# Patient Record
Sex: Female | Born: 1985 | Race: Black or African American | Hispanic: No | Marital: Single | Smoking: Current every day smoker
Health system: Southern US, Community
[De-identification: ages and names within clinical notes are randomized; demographics above are authoritative.]

## PROBLEM LIST (undated history)

## (undated) DIAGNOSIS — K029 Dental caries, unspecified: Secondary | ICD-10-CM

## (undated) DIAGNOSIS — R51 Headache: Secondary | ICD-10-CM

## (undated) DIAGNOSIS — R519 Headache, unspecified: Secondary | ICD-10-CM

## (undated) DIAGNOSIS — D649 Anemia, unspecified: Secondary | ICD-10-CM

## (undated) HISTORY — PX: CHOLECYSTECTOMY: SHX55

---

## 2016-03-07 ENCOUNTER — Emergency Department (HOSPITAL_BASED_OUTPATIENT_CLINIC_OR_DEPARTMENT_OTHER): Payer: Self-pay

## 2016-03-07 ENCOUNTER — Encounter (HOSPITAL_BASED_OUTPATIENT_CLINIC_OR_DEPARTMENT_OTHER): Payer: Self-pay | Admitting: *Deleted

## 2016-03-07 ENCOUNTER — Emergency Department (HOSPITAL_BASED_OUTPATIENT_CLINIC_OR_DEPARTMENT_OTHER)
Admission: EM | Admit: 2016-03-07 | Discharge: 2016-03-07 | Disposition: A | Discharge status: Home / Self Care | Payer: Self-pay | Attending: Emergency Medicine | Admitting: Emergency Medicine

## 2016-03-07 DIAGNOSIS — S8000XA Contusion of unspecified knee, initial encounter: Secondary | ICD-10-CM

## 2016-03-07 DIAGNOSIS — W109XXA Fall (on) (from) unspecified stairs and steps, initial encounter: Secondary | ICD-10-CM | POA: Insufficient documentation

## 2016-03-07 DIAGNOSIS — F17210 Nicotine dependence, cigarettes, uncomplicated: Secondary | ICD-10-CM | POA: Insufficient documentation

## 2016-03-07 DIAGNOSIS — Y929 Unspecified place or not applicable: Secondary | ICD-10-CM | POA: Insufficient documentation

## 2016-03-07 DIAGNOSIS — Y9301 Activity, walking, marching and hiking: Secondary | ICD-10-CM | POA: Insufficient documentation

## 2016-03-07 DIAGNOSIS — Y999 Unspecified external cause status: Secondary | ICD-10-CM | POA: Insufficient documentation

## 2016-03-07 DIAGNOSIS — S81011A Laceration without foreign body, right knee, initial encounter: Secondary | ICD-10-CM | POA: Insufficient documentation

## 2016-03-07 NOTE — ED Notes (Signed)
Bacitrain and dressing applied to wound.

## 2016-03-07 NOTE — ED Notes (Signed)
Patient transported to X-ray ambulatory with tech. 

## 2016-03-07 NOTE — ED Provider Notes (Signed)
CSN: [id number]     Arrival date & time 03/07/16  1055 History   First MD Initiated Contact with Patient 03/07/16 1103     Chief Complaint  Patient presents with  . Knee Injury   HPI Patient presents to the emergency room with complaints of right knee pain. Patient was walking up steps yesterday while holding her son. It was raining outside and she slipped on the step. Because she was holding her son she was not able to brace herself properly and she fell with her right knee against the step. Patient sustained a laceration and abrasion. She cleaned it out well last evening.  She was able to walk around on it. However, this morning when she woke up she had increasing pain and stiffness. She thinks it's mostly just sore but wants to make sure there was nothing else going on. She denies any fevers or chills. No numbness or weakness. No other injuries. Tetanus is up to date. History reviewed. No pertinent past medical history. Past Surgical History  Procedure Laterality Date  . Cholecystectomy     No family history on file. Social History  Substance Use Topics  . Smoking status: Current Every Day Smoker    Types: Cigarettes  . Smokeless tobacco: None  . Alcohol Use: Yes   OB History    No data available     Review of Systems    Allergies  Review of patient's allergies indicates no known allergies.  Home Medications   Prior to Admission medications   Not on File   BP 132/90 mmHg  Pulse 69  Temp(Src) 98.8 F (37.1 C) (Oral)  Resp 20  Ht  (1.626 m)  Wt 80.74 kg  BMI 30.54 kg/m2  SpO2 100%  LMP 02/29/2016 Physical Exam  Constitutional: She appears well-developed and well-nourished. No distress.  HENT:  Head: Normocephalic and atraumatic.  Right Ear: External ear normal.  Left Ear: External ear normal.  Eyes: Conjunctivae are normal. Right eye exhibits no discharge. Left eye exhibits no discharge. No scleral icterus.  Neck: Neck supple. No tracheal deviation present.   Cardiovascular: Normal rate.   Pulmonary/Chest: Effort normal. No stridor. No respiratory distress.  Musculoskeletal: She exhibits no edema.       Right knee: She exhibits laceration. She exhibits no swelling, no deformity, no erythema, normal alignment, no LCL laxity and normal patellar mobility. Tenderness (tenderness palpation around the scab on her knee, ) found.  Neurological: She is alert. Cranial nerve deficit: no gross deficits.  Skin: Skin is warm and dry. No rash noted.  Psychiatric: She has a normal mood and affect.  Nursing note and vitals reviewed.   ED Course  Procedures (including critical care time) Labs Review Labs Reviewed - No data to display  Imaging Review Dg Knee Complete 4 Views Right  03/07/2016  CLINICAL DATA:  Fall, right knee injury, abrasions and pain to right anterior knee. EXAM: RIGHT KNEE - COMPLETE 4+ VIEW COMPARISON:  None. FINDINGS: No evidence of fracture, dislocation, or joint effusion. No evidence of arthropathy or other focal bone abnormality. Soft tissues are unremarkable. IMPRESSION: Negative. Electronically Signed   By: Bary Richard M.D.   On: 03/07/2016 11:48   I have personally reviewed and evaluated these images and lab results as part of my medical decision-making.    MDM   Final diagnoses:  Knee contusion, unspecified laterality, initial encounter  Laceration of right knee, initial encounter    No fx on xray.  Wound is  healing, scab has formed.  No sign of infection.  Apply abx ointment daily.    Linwood DibblesJon Baby Gieger, MD 03/07/16 [phone]

## 2016-03-07 NOTE — ED Notes (Signed)
She was trying to prevent her son from falling down steps and she fell down the steps yesterday. Pain in her right knee. She has deep abrasions to her knee.

## 2016-03-07 NOTE — Discharge Instructions (Signed)
Contusion °A contusion is a deep bruise. Contusions are the result of a blunt injury to tissues and muscle fibers under the skin. The injury causes bleeding under the skin. The skin overlying the contusion may turn blue, purple, or yellow. Minor injuries will give you a painless contusion, but more severe contusions may stay painful and swollen for a few weeks.  °CAUSES  °This condition is usually caused by a blow, trauma, or direct force to an area of the body. °SYMPTOMS  °Symptoms of this condition include: °· Swelling of the injured area. °· Pain and tenderness in the injured area. °· Discoloration. The area may have redness and then turn blue, purple, or yellow. °DIAGNOSIS  °This condition is diagnosed based on a physical exam and medical history. An X-ray, CT scan, or MRI may be needed to determine if there are any associated injuries, such as broken bones (fractures). °TREATMENT  °Specific treatment for this condition depends on what area of the body was injured. In general, the best treatment for a contusion is resting, icing, applying pressure to (compression), and elevating the injured area. This is often called the RICE strategy. Over-the-counter anti-inflammatory medicines may also be recommended for pain control.  °HOME CARE INSTRUCTIONS  °· Rest the injured area. °· If directed, apply ice to the injured area: °· Put ice in a plastic bag. °· Place a towel between your skin and the bag. °· Leave the ice on for 20 minutes, 2-3 times per day. °· If directed, apply light compression to the injured area using an elastic bandage. Make sure the bandage is not wrapped too tightly. Remove and reapply the bandage as directed by your health care provider. °· If possible, raise (elevate) the injured area above the level of your heart while you are sitting or lying down. °· Take over-the-counter and prescription medicines only as told by your health care provider. °SEEK MEDICAL CARE IF: °· Your symptoms do not  improve after several days of treatment. °· Your symptoms get worse. °· You have difficulty moving the injured area. °SEEK IMMEDIATE MEDICAL CARE IF:  °· You have severe pain. °· You have numbness in a hand or foot. °· Your hand or foot turns pale or cold. °  °This information is not intended to replace advice given to you by your health care provider. Make sure you discuss any questions you have with your health care provider. °  °Document Released: 06/29/2005 Document Revised: 06/10/2015 Document Reviewed: 02/04/2015 °Elsevier Interactive Patient Education ©2016 [employer] ° °Laceration Care, Adult °A laceration is a cut that goes through all of the layers of the skin and into the tissue that is right under the skin. Some lacerations heal on their own. Others need to be closed with stitches (sutures), staples, skin adhesive strips, or skin glue. Proper laceration care minimizes the risk of infection and helps the laceration to heal better. °HOW TO CARE FOR YOUR LACERATION °If sutures or staples were used: °· Keep the wound clean and dry. °· If you were given a bandage (dressing), you should change it at least one time per day or as told by your health care provider. You should also change it if it becomes wet or dirty. °· Keep the wound completely dry for the first 24 hours or as told by your health care provider. After that time, you may shower or bathe. However, make sure that the wound is not soaked in water until after the sutures or staples have been removed. °·   Clean the wound one time each day or as told by your health care provider: °¨ Wash the wound with soap and water. °¨ Rinse the wound with water to remove all soap. °¨ Pat the wound dry with a clean towel. Do not rub the wound. °· After cleaning the wound, apply a thin layer of antibiotic ointment as told by your health care provider. This will help to prevent infection and keep the dressing from sticking to the wound. °· Have the sutures or staples  removed as told by your health care provider. °If skin adhesive strips were used: °· Keep the wound clean and dry. °· If you were given a bandage (dressing), you should change it at least one time per day or as told by your health care provider. You should also change it if it becomes dirty or wet. °· Do not get the skin adhesive strips wet. You may shower or bathe, but be careful to keep the wound dry. °· If the wound gets wet, pat it dry with a clean towel. Do not rub the wound. °· Skin adhesive strips fall off on their own. You may trim the strips as the wound heals. Do not remove skin adhesive strips that are still stuck to the wound. They will fall off in time. °If skin glue was used: °· Try to keep the wound dry, but you may briefly wet it in the shower or bath. Do not soak the wound in water, such as by swimming. °· After you have showered or bathed, gently pat the wound dry with a clean towel. Do not rub the wound. °· Do not do any activities that will make you sweat heavily until the skin glue has fallen off on its own. °· Do not apply liquid, cream, or ointment medicine to the wound while the skin glue is in place. Using those may loosen the film before the wound has healed. °· If you were given a bandage (dressing), you should change it at least one time per day or as told by your health care provider. You should also change it if it becomes dirty or wet. °· If a dressing is placed over the wound, be careful not to apply tape directly over the skin glue. Doing that may cause the glue to be pulled off before the wound has healed. °· Do not pick at the glue. The skin glue usually remains in place for 5-10 days, then it falls off of the skin. °General Instructions °· Take over-the-counter and prescription medicines only as told by your health care provider. °· If you were prescribed an antibiotic medicine or ointment, take or apply it as told by your doctor. Do not stop using it even if your condition  improves. °· To help prevent scarring, make sure to cover your wound with sunscreen whenever you are outside after stitches are removed, after adhesive strips are removed, or when glue remains in place and the wound is healed. Make sure to wear a sunscreen of at least 30 SPF. °· Do not scratch or pick at the wound. °· Keep all follow-up visits as told by your health care provider. This is important. °· Check your wound every day for signs of infection. Watch for: °¨ Redness, swelling, or pain. °¨ Fluid, blood, or pus. °· Raise (elevate) the injured area above the level of your heart while you are sitting or lying down, if possible. °SEEK MEDICAL CARE IF: °· You received a tetanus shot and you have swelling,   severe pain, redness, or bleeding at the injection site. °· You have a fever. °· A wound that was closed breaks open. °· You notice a bad smell coming from your wound or your dressing. °· You notice something coming out of the wound, such as wood or glass. °· Your pain is not controlled with medicine. °· You have increased redness, swelling, or pain at the site of your wound. °· You have fluid, blood, or pus coming from your wound. °· You notice a change in the color of your skin near your wound. °· You need to change the dressing frequently due to fluid, blood, or pus draining from the wound. °· You develop a new rash. °· You develop numbness around the wound. °SEEK IMMEDIATE MEDICAL CARE IF: °· You develop severe swelling around the wound. °· Your pain suddenly increases and is severe. °· You develop painful lumps near the wound or on skin that is anywhere on your body. °· You have a red streak going away from your wound. °· The wound is on your hand or foot and you cannot properly move a finger or toe. °· The wound is on your hand or foot and you notice that your fingers or toes look pale or bluish. °  °This information is not intended to replace advice given to you by your health care provider. Make sure you  discuss any questions you have with your health care provider. °  °Document Released: 09/19/2005 Document Revised: 02/03/2015 Document Reviewed: 09/15/2014 °Elsevier Interactive Patient Education ©2016 [employer] ° °

## 2016-03-07 NOTE — ED Notes (Signed)
MD at bedside. 

## 2018-06-14 ENCOUNTER — Other Ambulatory Visit: Payer: Self-pay

## 2018-06-14 ENCOUNTER — Encounter (HOSPITAL_COMMUNITY): Payer: Self-pay | Admitting: *Deleted

## 2018-06-14 NOTE — Progress Notes (Signed)
Pt called and stated that she doesn't have child care and the person who was bringing her can't now. She states she needs to cancel surgery for tomorrow. I instructed her to call Dr. Randa EvensJensen's office Monday to reschedule. I called Dr. Barbette MerinoJensen and notified him that pt is cancelling.

## 2018-06-14 NOTE — Progress Notes (Signed)
Spoke with pt for pre-op call. Pt denies cardiac history, HTN or diabetes.   Pt has concerns about time of surgery because of needing to get children to school. . I encouraged her to call [facility]

## 2018-06-15 ENCOUNTER — Ambulatory Visit (HOSPITAL_COMMUNITY): Admission: RE | Admit: 2018-06-15 | Payer: Medicaid Other | Source: Ambulatory Visit | Admitting: Oral Surgery

## 2018-06-15 HISTORY — DX: Anemia, unspecified: D64.9

## 2018-06-15 SURGERY — EXTRACTION, TOOTH, MOLAR
Anesthesia: General | Laterality: Bilateral

## 2018-08-01 ENCOUNTER — Other Ambulatory Visit: Payer: Self-pay

## 2018-08-01 ENCOUNTER — Encounter (HOSPITAL_COMMUNITY): Payer: Self-pay | Admitting: *Deleted

## 2018-08-01 NOTE — Progress Notes (Addendum)
Pt denies SOB, chest pain, and being under the care of a cardiologist. Pt denies having a stress test , echo and cardiac cath. Pt denies having an EKG and chest x ray within the last year. Pt denies recent labs. Pt made aware to stop taking Aspirin, vitamins, fish oil and herbal medications. Do not take any NSAIDs ie: Ibuprofen, Advil, Naproxen (Aleve), Motrin, BC and Goody Powder. Pt stated  " the doctor said that I can rotate between Tylenol and Motrin." Pt advised to follow surgeon's instructions. Pt verbalized understanding of all pre-op instructions.

## 2018-08-02 NOTE — Anesthesia Preprocedure Evaluation (Addendum)
Anesthesia Evaluation  Patient identified by MRN, date of birth, ID band Patient awake    Reviewed: Allergy & Precautions, NPO status , Patient's Chart, lab work & pertinent test results  History of Anesthesia Complications Negative for: history of anesthetic complications  Airway Mallampati: I  TM Distance: >3 FB Neck ROM: Full    Dental no notable dental hx. (+) Teeth Intact, Dental Advisory Given   Pulmonary Current Smoker,    Pulmonary exam normal breath sounds clear to auscultation       Cardiovascular negative cardio ROS Normal cardiovascular exam Rhythm:Regular Rate:Normal     Neuro/Psych negative neurological ROS     GI/Hepatic negative GI ROS, Neg liver ROS,   Endo/Other  negative endocrine ROS  Renal/GU negative Renal ROS     Musculoskeletal negative musculoskeletal ROS (+)   Abdominal   Peds  Hematology negative hematology ROS (+)   Anesthesia Other Findings Day of surgery medications reviewed with the patient.  Reproductive/Obstetrics                            Anesthesia Physical Anesthesia Plan  ASA: II  Anesthesia Plan: General   Post-op Pain Management:    Induction: Intravenous  PONV Risk Score and Plan: 2 and Dexamethasone, Ondansetron and Treatment may vary due to age or medical condition  Airway Management Planned: Nasal ETT  Additional Equipment:   Intra-op Plan:   Post-operative Plan: Extubation in OR  Informed Consent: I have reviewed the patients History and Physical, chart, labs and discussed the procedure including the risks, benefits and alternatives for the proposed anesthesia with the patient or authorized representative who has indicated his/her understanding and acceptance.   Dental advisory given  Plan Discussed with: CRNA and Surgeon  Anesthesia Plan Comments:        Anesthesia Quick Evaluation

## 2018-08-03 ENCOUNTER — Encounter (HOSPITAL_COMMUNITY)
Admission: RE | Disposition: A | Discharge status: Home / Self Care | Payer: Self-pay | Source: Ambulatory Visit | Attending: Oral Surgery

## 2018-08-03 ENCOUNTER — Ambulatory Visit (HOSPITAL_COMMUNITY)
Admission: RE | Admit: 2018-08-03 | Discharge: 2018-08-03 | Disposition: A | Discharge status: Home / Self Care | Payer: Medicaid Other | Source: Ambulatory Visit | Attending: Oral Surgery | Admitting: Oral Surgery

## 2018-08-03 ENCOUNTER — Other Ambulatory Visit: Payer: Self-pay

## 2018-08-03 ENCOUNTER — Ambulatory Visit (HOSPITAL_COMMUNITY): Payer: Medicaid Other | Admitting: Anesthesiology

## 2018-08-03 ENCOUNTER — Encounter (HOSPITAL_COMMUNITY): Payer: Self-pay

## 2018-08-03 DIAGNOSIS — F17210 Nicotine dependence, cigarettes, uncomplicated: Secondary | ICD-10-CM | POA: Insufficient documentation

## 2018-08-03 DIAGNOSIS — R51 Headache: Secondary | ICD-10-CM | POA: Insufficient documentation

## 2018-08-03 DIAGNOSIS — K029 Dental caries, unspecified: Secondary | ICD-10-CM | POA: Insufficient documentation

## 2018-08-03 HISTORY — DX: Headache, unspecified: R51.9

## 2018-08-03 HISTORY — PX: TOOTH EXTRACTION: SHX859

## 2018-08-03 HISTORY — DX: Headache: R51

## 2018-08-03 HISTORY — DX: Dental caries, unspecified: K02.9

## 2018-08-03 LAB — CBC
HCT: 41.5 % (ref 36.0–46.0)
Hemoglobin: 12.4 g/dL (ref 12.0–15.0)
MCH: 25.3 pg — AB (ref 26.0–34.0)
MCHC: 29.9 g/dL — ABNORMAL LOW (ref 30.0–36.0)
MCV: 84.7 fL (ref 80.0–100.0)
PLATELETS: 377 10*3/uL (ref 150–400)
RBC: 4.9 MIL/uL (ref 3.87–5.11)
RDW: 16.6 % — AB (ref 11.5–15.5)
WBC: 7.1 10*3/uL (ref 4.0–10.5)
nRBC: 0 % (ref 0.0–0.2)

## 2018-08-03 LAB — POCT PREGNANCY, URINE: PREG TEST UR: NEGATIVE

## 2018-08-03 SURGERY — DENTAL RESTORATION/EXTRACTIONS
Anesthesia: General | Site: Mouth

## 2018-08-03 MED ORDER — ACETAMINOPHEN 10 MG/ML IV SOLN: 1000.0000 mg | Freq: Once | INTRAVENOUS | Status: DC | PRN

## 2018-08-03 MED ORDER — HYDROCODONE-ACETAMINOPHEN 5-325 MG PO TABS: 1.0000 | ORAL_TABLET | Freq: Four times a day (QID) | ORAL | 0 refills | Status: AC | PRN

## 2018-08-03 MED ORDER — PROMETHAZINE HCL 25 MG/ML IJ SOLN
6.2500 mg | INTRAMUSCULAR | Status: DC | PRN
Start: 2018-08-03 — End: 2018-08-03

## 2018-08-03 MED ORDER — ONDANSETRON HCL 4 MG/2ML IJ SOLN
INTRAMUSCULAR | Status: DC | PRN
  Administered 2018-08-03: 4 mg via INTRAVENOUS

## 2018-08-03 MED ORDER — MIDAZOLAM HCL 5 MG/5ML IJ SOLN
INTRAMUSCULAR | Status: DC | PRN
  Administered 2018-08-03: 2 mg via INTRAVENOUS

## 2018-08-03 MED ORDER — FENTANYL CITRATE (PF) 250 MCG/5ML IJ SOLN
INTRAMUSCULAR | Status: AC
Start: 2018-08-03 — End: ?
  Filled 2018-08-03: qty 5

## 2018-08-03 MED ORDER — DEXAMETHASONE SODIUM PHOSPHATE 10 MG/ML IJ SOLN
INTRAMUSCULAR | Status: DC | PRN
  Administered 2018-08-03: 10 mg via INTRAVENOUS

## 2018-08-03 MED ORDER — ROCURONIUM BROMIDE 50 MG/5ML IV SOSY
PREFILLED_SYRINGE | INTRAVENOUS | Status: DC | PRN
  Administered 2018-08-03: 50 mg via INTRAVENOUS

## 2018-08-03 MED ORDER — CEFAZOLIN SODIUM-DEXTROSE 2-4 GM/100ML-% IV SOLN
2.0000 g | INTRAVENOUS | Status: AC
  Administered 2018-08-03: 2 g via INTRAVENOUS

## 2018-08-03 MED ORDER — FENTANYL CITRATE (PF) 100 MCG/2ML IJ SOLN
INTRAMUSCULAR | Status: DC | PRN
  Administered 2018-08-03: 100 ug via INTRAVENOUS

## 2018-08-03 MED ORDER — OXYCODONE HCL 5 MG/5ML PO SOLN: 5.0000 mg | Freq: Once | ORAL | Status: DC | PRN

## 2018-08-03 MED ORDER — OXYMETAZOLINE HCL 0.05 % NA SOLN
NASAL | Status: AC
  Filled 2018-08-03: qty 15

## 2018-08-03 MED ORDER — 0.9 % SODIUM CHLORIDE (POUR BTL) OPTIME
TOPICAL | Status: DC | PRN
  Administered 2018-08-03: 200 mL

## 2018-08-03 MED ORDER — LIDOCAINE 2% (20 MG/ML) 5 ML SYRINGE
INTRAMUSCULAR | Status: DC | PRN
  Administered 2018-08-03: 100 mg via INTRAVENOUS

## 2018-08-03 MED ORDER — LACTATED RINGERS IV SOLN
INTRAVENOUS | Status: DC | PRN
  Administered 2018-08-03: 08:00:00 via INTRAVENOUS

## 2018-08-03 MED ORDER — SUGAMMADEX SODIUM 200 MG/2ML IV SOLN
INTRAVENOUS | Status: DC | PRN
  Administered 2018-08-03: 381.2 mg via INTRAVENOUS

## 2018-08-03 MED ORDER — CEFAZOLIN SODIUM-DEXTROSE 2-4 GM/100ML-% IV SOLN
INTRAVENOUS | Status: AC
  Filled 2018-08-03: qty 100

## 2018-08-03 MED ORDER — FENTANYL CITRATE (PF) 100 MCG/2ML IJ SOLN
25.0000 ug | INTRAMUSCULAR | Status: DC | PRN
  Administered 2018-08-03: 25 ug via INTRAVENOUS

## 2018-08-03 MED ORDER — PROPOFOL 10 MG/ML IV BOLUS
INTRAVENOUS | Status: AC
  Filled 2018-08-03: qty 20

## 2018-08-03 MED ORDER — MIDAZOLAM HCL 2 MG/2ML IJ SOLN
INTRAMUSCULAR | Status: AC
  Filled 2018-08-03: qty 2

## 2018-08-03 MED ORDER — PROPOFOL 10 MG/ML IV BOLUS
INTRAVENOUS | Status: DC | PRN
  Administered 2018-08-03: 200 mg via INTRAVENOUS

## 2018-08-03 MED ORDER — FENTANYL CITRATE (PF) 100 MCG/2ML IJ SOLN
INTRAMUSCULAR | Status: AC
  Filled 2018-08-03: qty 2

## 2018-08-03 MED ORDER — LACTATED RINGERS IV SOLN
INTRAVENOUS | Status: DC
  Administered 2018-08-03: 07:00:00 via INTRAVENOUS

## 2018-08-03 MED ORDER — LIDOCAINE-EPINEPHRINE 2 %-1:100000 IJ SOLN
INTRAMUSCULAR | Status: AC
  Filled 2018-08-03: qty 1

## 2018-08-03 MED ORDER — SODIUM CHLORIDE 0.9 % IV SOLN
INTRAVENOUS | Status: AC | PRN
  Administered 2018-08-03: 200 mL

## 2018-08-03 MED ORDER — OXYCODONE HCL 5 MG PO TABS: 5.0000 mg | ORAL_TABLET | Freq: Once | ORAL | Status: DC | PRN

## 2018-08-03 MED ORDER — LIDOCAINE-EPINEPHRINE 2 %-1:100000 IJ SOLN
INTRAMUSCULAR | Status: DC | PRN
  Administered 2018-08-03: 10 mL

## 2018-08-03 MED ORDER — AMOXICILLIN 500 MG PO CAPS: 500.0000 mg | ORAL_CAPSULE | Freq: Three times a day (TID) | ORAL | 0 refills | Status: AC

## 2018-08-03 SURGICAL SUPPLY — 38 items
BLADE SURG 15 STRL LF DISP TIS (BLADE) ×1 IMPLANT
BLADE SURG 15 STRL SS (BLADE) ×3
BUR CROSS CUT FISSURE 1.6 (BURR) ×2 IMPLANT
BUR CROSS CUT FISSURE 1.6MM (BURR) ×1
BUR EGG ELITE 4.0 (BURR) ×2 IMPLANT
BUR EGG ELITE 4.0MM (BURR) ×1
CANISTER SUCT 3000ML PPV (MISCELLANEOUS) ×3 IMPLANT
COVER SURGICAL LIGHT HANDLE (MISCELLANEOUS) ×3 IMPLANT
COVER WAND RF STERILE (DRAPES) ×3 IMPLANT
DECANTER SPIKE VIAL GLASS SM (MISCELLANEOUS) ×3 IMPLANT
DRAPE U-SHAPE 76X120 STRL (DRAPES) ×3 IMPLANT
GAUZE PACKING FOLDED 2  STR (GAUZE/BANDAGES/DRESSINGS) ×2
GAUZE PACKING FOLDED 2 STR (GAUZE/BANDAGES/DRESSINGS) ×1 IMPLANT
GLOVE BIO SURGEON STRL SZ 6.5 (GLOVE) IMPLANT
GLOVE BIO SURGEON STRL SZ7 (GLOVE) IMPLANT
GLOVE BIO SURGEON STRL SZ7.5 (GLOVE) ×3 IMPLANT
GLOVE BIO SURGEONS STRL SZ 6.5 (GLOVE)
GLOVE BIOGEL PI IND STRL 6.5 (GLOVE) IMPLANT
GLOVE BIOGEL PI IND STRL 7.0 (GLOVE) IMPLANT
GLOVE BIOGEL PI INDICATOR 6.5 (GLOVE)
GLOVE BIOGEL PI INDICATOR 7.0 (GLOVE)
GOWN STRL REUS W/ TWL LRG LVL3 (GOWN DISPOSABLE) ×1 IMPLANT
GOWN STRL REUS W/ TWL XL LVL3 (GOWN DISPOSABLE) ×1 IMPLANT
GOWN STRL REUS W/TWL LRG LVL3 (GOWN DISPOSABLE) ×3
GOWN STRL REUS W/TWL XL LVL3 (GOWN DISPOSABLE) ×3
IV NS 1000ML (IV SOLUTION) ×3
IV NS 1000ML BAXH (IV SOLUTION) ×1 IMPLANT
KIT BASIN OR (CUSTOM PROCEDURE TRAY) ×3 IMPLANT
KIT TURNOVER KIT B (KITS) ×3 IMPLANT
NEEDLE 22X1 1/2 (OR ONLY) (NEEDLE) ×6 IMPLANT
NS IRRIG 1000ML POUR BTL (IV SOLUTION) ×3 IMPLANT
PAD ARMBOARD 7.5X6 YLW CONV (MISCELLANEOUS) ×3 IMPLANT
SPONGE SURGIFOAM ABS GEL 12-7 (HEMOSTASIS) IMPLANT
SUT CHROMIC 3 0 PS 2 (SUTURE) ×3 IMPLANT
SYR CONTROL 10ML LL (SYRINGE) ×3 IMPLANT
TRAY ENT MC OR (CUSTOM PROCEDURE TRAY) ×3 IMPLANT
TUBING IRRIGATION (MISCELLANEOUS) ×3 IMPLANT
YANKAUER SUCT BULB TIP NO VENT (SUCTIONS) ×3 IMPLANT

## 2018-08-03 NOTE — Anesthesia Postprocedure Evaluation (Signed)
Anesthesia Post Note  Patient: Melissa Good  Procedure(s) Performed: DENTAL RESTORATION/EXTRACTIONS/ THIRDS X4 (N/A Mouth)     Patient location during evaluation: PACU Anesthesia Type: General Level of consciousness: awake and alert Pain management: pain level controlled Vital Signs Assessment: post-procedure vital signs reviewed and stable Respiratory status: spontaneous breathing, nonlabored ventilation and respiratory function stable Cardiovascular status: blood pressure returned to baseline and stable Postop Assessment: no apparent nausea or vomiting Anesthetic complications: no    Last Vitals:  Vitals:   08/03/18 1022 08/03/18 1040  BP: (!) 129/94 123/84  Pulse: 72 84  Resp: 16 15  Temp:  36.8 C  SpO2: 99% 100%    Last Pain:  Vitals:   08/03/18 1040  TempSrc:   PainSc: 0-No pain                 Kaylyn Layer

## 2018-08-03 NOTE — Transfer of Care (Signed)
Immediate Anesthesia Transfer of Care Note  Patient: Melissa Good  Procedure(s) Performed: DENTAL RESTORATION/EXTRACTIONS/ THIRDS X4 (N/A Mouth)  Patient Location: PACU  Anesthesia Type:General  Level of Consciousness: awake, alert  and oriented  Airway & Oxygen Therapy: Patient Spontanous Breathing and Patient connected to nasal cannula oxygen  Post-op Assessment: Report given to RN, Post -op Vital signs reviewed and stable and Patient moving all extremities X 4  Post vital signs: Reviewed and stable  Last Vitals:  Vitals Value Taken Time  BP    Temp    Pulse 131 08/03/2018  9:52 AM  Resp 24 08/03/2018  9:52 AM  SpO2 98 % 08/03/2018  9:52 AM  Vitals shown include unvalidated device data.  Last Pain:  Vitals:   08/03/18 0700  TempSrc:   PainSc: 10-Worst pain ever         Complications: No apparent anesthesia complications

## 2018-08-03 NOTE — Anesthesia Procedure Notes (Signed)
Procedure Name: Intubation Date/Time: 08/03/2018 9:22 AM Performed by: Carmela Rima, CRNA Pre-anesthesia Checklist: Timeout performed, Patient being monitored, Suction available, Emergency Drugs available and Patient identified Patient Re-evaluated:Patient Re-evaluated prior to induction Oxygen Delivery Method: Circle system utilized Preoxygenation: Pre-oxygenation with 100% oxygen Induction Type: IV induction Ventilation: Mask ventilation without difficulty and Oral airway inserted - appropriate to patient size Nasal Tubes: Right, Nasal Rae and Magill forceps- large, utilized Tube size: 7.0 mm Number of attempts: 1 Placement Confirmation: breath sounds checked- equal and bilateral,  positive ETCO2 and ETT inserted through vocal cords under direct vision Tube secured with: Tape Dental Injury: Teeth and Oropharynx as per pre-operative assessment

## 2018-08-03 NOTE — H&P (Signed)
HISTORY AND PHYSICAL  Melissa Good is a 32 y.o. female patient with CC: painful wisdom teeth  No diagnosis found.  Past Medical History:  Diagnosis Date  . Anemia   . Dental caries   . Headache     Current Facility-Administered Medications  Medication Dose Route Frequency Provider Last Rate Last Dose  . ceFAZolin (ANCEF) 2-4 GM/100ML-% IVPB           . ceFAZolin (ANCEF) IVPB 2g/100 mL premix  2 g Intravenous On Call to OR Ocie Doyne, DDS      . lactated ringers infusion   Intravenous Continuous Kaylyn Layer, MD 50 mL/hr at 08/03/18 0706     No Known Allergies Active Problems:   * No active hospital problems. *  Vitals: Blood pressure 134/75, pulse 65, temperature 98.6 F (37 C), temperature source Oral, resp. rate 20, height 5\' 4"  (1.626 m), weight 95.3 kg, last menstrual period 07/01/2018, SpO2 100 %. Lab results: Results for orders placed or performed during the hospital encounter of 08/03/18 (from the past 24 hour(s))  Pregnancy, urine POC     Status: None   Collection Time: 08/03/18  7:08 AM  Result Value Ref Range   Preg Test, Ur NEGATIVE NEGATIVE   Radiology Results: No results found. General appearance: alert and cooperative Head: Normocephalic, without obvious abnormality, atraumatic Eyes: negative Nose: Nares normal. Septum midline. Mucosa normal. No drainage or sinus tenderness. Throat: pharynx clear, carious wisdom teeth. Neck: no adenopathy, supple, symmetrical, trachea midline and thyroid not enlarged, symmetric, no tenderness/mass/nodules Resp: clear to auscultation bilaterally Cardio: regular rate and rhythm, S1, S2 normal, no murmur, click, rub or gallop  Assessment: Nonrestorable teeth 1, 16, 17, 32  Plan: Dental extractions. GA. Day surgery.    Ocie Doyne 08/03/2018

## 2018-08-03 NOTE — Op Note (Signed)
NAME: Melissa Good, Melissa Good MEDICAL RECORD [id number] ACCOUNT [id number] DATE OF BIRTH:09-Oct-1985 FACILITY: [facility] LOCATION: [facility] PHYSICIAN:Cherese Lozano M. Anessa Charley, DDS  OPERATIVE REPORT  DATE OF PROCEDURE:  08/03/2018  PREOPERATIVE DIAGNOSIS:  Nonrestorable teeth numbers 1, 16, 17 and 32.  POSTOPERATIVE DIAGNOSIS:  Nonrestorable teeth numbers 1, 16, 17 and 32.  PROCEDURE:  Extraction teeth numbers 1, 16, 17 and 32.  SURGEON:  Georgia Lopes, DDS  ANESTHESIA:  General nasal intubation.  DESCRIPTION OF PROCEDURE:  The patient was taken to the operating room and placed on the table in supine position.  General anesthesia was administered intravenously, and a nasal endotracheal tube was placed and secured.  The eyes were protected and the  patient was draped for surgery.  A timeout was performed.  The posterior pharynx was suctioned and a throat pack was placed.  Lidocaine 2% with 1:100,000 epinephrine was infiltrated in the inferior alveolar block and in buccal and palatal infiltration of  the posterior maxilla around teeth numbers 1 and 16.  A bite block was placed on the right side of the mouth, and a sweetheart retractor was used to retract the tongue.  A #15 blade was used to make an incision around tooth #16 and #17.  The periosteum  was reflected from around these teeth.  Bone was removed buccally around tooth #17 with a Stryker handpiece under irrigation.  The teeth were elevated and removed from the mouth with the dental forceps.  Tooth #17 fractured upon removal, necessitating  more removal of bone and sectioning of the roots.  The roots were then removed with East-West elevators.  The socket was then curetted, and tooth #17 was sutured.  Number 16 required no suture.  The bite block and sweetheart retractor were repositioned  to the other side of the mouth.  A 15 blade was used to make an incision around teeth #1 and #32.  The periosteum was reflected.  The teeth were elevated with a 301  elevator.  Tooth #1 was removed with the forceps.  Tooth #32 was partially removed and  the tooth fractured.  Then the roots were sectioned and additional bone was removed using a Stryker handpiece.  Then the tooth was removed with a 301 elevator and an Financial controller.  Then, the sockets were irrigated.  Tooth #32 required a suture.   Tooth #1 did not.  A 3-0 chromic was used for the suture.  Then, the oral cavity was irrigated and suctioned.  The throat pack was removed.  The patient was left in care of anesthesia for extubation and transported to recovery room with plans for  discharge home through day surgery.  ESTIMATED BLOOD LOSS:  Minimal.  COMPLICATIONS:  None.  SPECIMENS:  None.  LN/NUANCE  D:08/03/2018 T:08/03/2018 JOB:[id number]

## 2018-08-03 NOTE — Op Note (Signed)
08/03/2018  9:38 AM  PATIENT:  Melissa Good  32 y.o. female  PRE-OPERATIVE DIAGNOSIS:  NON-RESTORABLE TEETH # 1, 16, 17, 32 POST-OPERATIVE DIAGNOSIS:  SAME  PROCEDURE:  Procedure(s): DENTAL EXTRACTIONS # 1, 16, 17, 32  SURGEON:  Surgeon(s): Barbette Merino, Acupuncturist, DDS  ANESTHESIA:   local and general  EBL:  minimal  DRAINS: none   SPECIMEN:  No Specimen  COUNTS:  YES  PLAN OF CARE: Discharge to home after PACU  PATIENT DISPOSITION:  PACU - hemodynamically stable.   PROCEDURE DETAILS: Dictation # [id number]  Melissa Good, DMD 08/03/2018 9:38 AM

## 2018-08-04 ENCOUNTER — Encounter (HOSPITAL_COMMUNITY): Payer: Self-pay | Admitting: Oral Surgery

## 2020-07-17 ENCOUNTER — Other Ambulatory Visit: Payer: Self-pay

## 2020-07-17 ENCOUNTER — Emergency Department (HOSPITAL_BASED_OUTPATIENT_CLINIC_OR_DEPARTMENT_OTHER): Payer: Medicaid Other

## 2020-07-17 ENCOUNTER — Emergency Department (HOSPITAL_BASED_OUTPATIENT_CLINIC_OR_DEPARTMENT_OTHER)
Admission: EM | Admit: 2020-07-17 | Discharge: 2020-07-17 | Disposition: A | Discharge status: Home / Self Care | Payer: Medicaid Other | Attending: Emergency Medicine | Admitting: Emergency Medicine

## 2020-07-17 ENCOUNTER — Encounter (HOSPITAL_BASED_OUTPATIENT_CLINIC_OR_DEPARTMENT_OTHER): Payer: Self-pay | Admitting: *Deleted

## 2020-07-17 DIAGNOSIS — M25532 Pain in left wrist: Secondary | ICD-10-CM | POA: Insufficient documentation

## 2020-07-17 DIAGNOSIS — F17210 Nicotine dependence, cigarettes, uncomplicated: Secondary | ICD-10-CM | POA: Insufficient documentation

## 2020-07-17 NOTE — Discharge Instructions (Signed)
Please wear wrist brace to help with pain. I would recommend wearing this at nighttime as well as during the day while at home.   When you are not using your wrist/hand at work I would recommend elevating your arm to reduce inflammation. You can apply ice for 20 minutes every hour. I would also recommend 600 mg Ibuprofen every 8 hours and 1,000 mg Tylenol every 8 hours as needed for pain (alternate medication every 4 hours).   You may need a repeat xray in 7-10 days to assess for a scaphoid bone fracture however your symptoms may also be related to carpal tunnel syndrome. Please discuss this with Dr. Jordan Likes when you see him.   Return to the ED for any worsening symptoms.

## 2020-07-17 NOTE — ED Provider Notes (Signed)
[facility] EMERGENCY DEPARTMENT Provider Note   CSN: [id number] Arrival date & time: 07/17/20  1114     History Chief Complaint  Patient presents with  . Hand Pain  . Wrist Pain    Melissa Good is a 34 y.o. female who presents to the ED today with complaint of gradual onset, constant, achy/throbbing, left hand/wrist pain that began yesterday while at work. 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 She reports she has been unable to do this due to the pain. Her employer provided her with a soft hand sleeve yesterday however her pain is exacerbated with wrist movement. She states when she moves her wrist to flex/extend she gets a shooting pain into her hand. She has not taken anything for pain. She has no other complaints at this time. Denies any injury.   The history is provided by the patient and medical records.       Past Medical History:  Diagnosis Date  . Anemia   . Dental caries   . Headache     There are no problems to display for this patient.   Past Surgical History:  Procedure Laterality Date  . CHOLECYSTECTOMY    . TOOTH EXTRACTION N/A 08/03/2018   Procedure: DENTAL RESTORATION/EXTRACTIONS/ THIRDS X4;  Surgeon: Ocie Doyne, DDS;  Location: [facility] OR;  Service: Oral Surgery;  Laterality: N/A;     OB History   No obstetric history on file.     Family History  Problem Relation Age of Onset  . Hypertension Mother   . Diabetes Mother   . Hypertension Father     Social History   Tobacco Use  . Smoking status: Current Every Day Smoker    Packs/day: 0.25    Types: Cigarettes  . Smokeless tobacco: Never Used  Vaping Use  . Vaping Use: Never used  Substance Use Topics  . Alcohol use: Yes    Comment: occasional  . Drug use: No    Home Medications Prior to Admission medications   Medication Sig Start Date End Date Taking? Authorizing Provider  amoxicillin (AMOXIL) 500 MG capsule Take 1 capsule (500 mg total) by  mouth 3 (three) times daily. 08/03/18   Ocie Doyne, DDS  HYDROcodone-acetaminophen (NORCO) 5-325 MG tablet Take 1 tablet by mouth every 6 (six) hours as needed for moderate pain. 08/03/18   Ocie Doyne, DDS  terbinafine (LAMISIL) 250 MG tablet Take 1 tablet by mouth daily. 05/01/18   [provider]    Allergies    Patient has no known allergies.  Review of Systems   Review of Systems  Constitutional: Negative for chills and fever.  Musculoskeletal: Positive for arthralgias and joint swelling.  Skin: Negative for wound.  Neurological: Negative for weakness and numbness.    Physical Exam Updated Vital Signs BP 122/77   Pulse 63   Temp 98.3 F (36.8 C) (Oral)   Resp 18   Ht 5\' 4"  (1.626 m)   Wt 84.4 kg   SpO2 97%   BMI 31.94 kg/m   Physical Exam Vitals and nursing note reviewed.  Constitutional:      Appearance: She is not ill-appearing or diaphoretic.  HENT:     Head: Normocephalic and atraumatic.  Eyes:     Conjunctiva/sclera: Conjunctivae normal.  Cardiovascular:     Rate and Rhythm: Normal rate and regular rhythm.     Pulses: Normal pulses.  Pulmonary:     Effort: Pulmonary effort is normal.  Breath sounds: Normal breath sounds. No wheezing, rhonchi or rales.  Musculoskeletal:     Comments: Moderate swelling noted to diffuse left hand compared to right. No erythema or increased warmth. + TTP to the radial aspect of the left wrist along scaphoid as well as the 1st and 2nd metacarpals. ROM limited to wrist s/2 pain. Able to wiggle fingers without difficulty. Cap refill < 2 seconds. 2+ radial pulse. Pt unable to perform Tinels or Phalen's s/2 pain.   Skin:    General: Skin is warm and dry.     Coloration: Skin is not jaundiced.  Neurological:     Mental Status: She is alert.     ED Results / Procedures / Treatments   Labs (all labs ordered are listed, but only abnormal results are displayed) Labs Reviewed - No data to  display  EKG None  Radiology DG Wrist Complete Left  Result Date: 07/17/2020 CLINICAL DATA:  Left hand and wrist pain, most pronounced at the base of thumb EXAM: LEFT HAND - COMPLETE 3+ VIEW; LEFT WRIST - COMPLETE 3+ VIEW COMPARISON:  None. FINDINGS: There is no evidence of acute fracture or dislocation. Chronic healed posttraumatic deformity of the base of the long finger distal phalanx. Joint spaces are maintained. No erosion. There is no evidence of arthropathy or other focal bone abnormality. Soft tissues are unremarkable. IMPRESSION: No acute osseous abnormality of the left hand or wrist. Electronically Signed   By: Duanne Guess D.O.   On: 07/17/2020 12:00   DG Hand Complete Left  Result Date: 07/17/2020 CLINICAL DATA:  Left hand and wrist pain, most pronounced at the base of thumb EXAM: LEFT HAND - COMPLETE 3+ VIEW; LEFT WRIST - COMPLETE 3+ VIEW COMPARISON:  None. FINDINGS: There is no evidence of acute fracture or dislocation. Chronic healed posttraumatic deformity of the base of the long finger distal phalanx. Joint spaces are maintained. No erosion. There is no evidence of arthropathy or other focal bone abnormality. Soft tissues are unremarkable. IMPRESSION: No acute osseous abnormality of the left hand or wrist. Electronically Signed   By: Duanne Guess D.O.   On: 07/17/2020 12:00    Procedures Procedures (including critical care time)  Medications Ordered in ED Medications - No data to display  ED Course  I have reviewed the triage vital signs and the nursing notes.  Pertinent labs & imaging results that were available during my care of the patient were reviewed by me and considered in my medical decision making (see chart for details).    MDM Rules/Calculators/A&P                          34 year old female presents to the ED today complaining of atraumatic left wrist and left hand pain that began yesterday at work.  She is left-hand dominant and does repetitive  motions at work including folding clothes.  She states that the pain is exacerbated with movement of her wrist.  Trays were taken while patient was in the waiting room without any acute bony abnormality of the hand or wrist.  On exam patient has moderate swelling to her left hand compared to the right.  She has tenderness to the radial aspect of her left wrist as well as the first and second metacarpals of the hand.  Her range of motion is limited secondary to pain.  I am unable to fully assess for carpal tunnel at this time given amount of pain patient is  in.  Question of carpal tunnel given repetitive motions at work however the swelling makes me think something else may be going on.  She does have tenderness across the snuffbox as well.  Will provide thumb spica at this time and have patient follow-up with sports medicine early next week for further evaluation.  Rice therapy has been discussed with patient.  Will provide work note at this time however patient states she needs to go to work to make a living, she will use this at her own discretion.  Patient stable for discharge at this time.   This note was prepared using Dragon voice recognition software and may include unintentional dictation errors due to the inherent limitations of voice recognition software.  Final Clinical Impression(s) / ED Diagnoses Final diagnoses:  Left wrist pain    Rx / DC Orders ED Discharge Orders    None       Discharge Instructions     Please wear wrist brace to help with pain. I would recommend wearing this at nighttime as well as during the day while at home.   When you are not using your wrist/hand at work I would recommend elevating your arm to reduce inflammation. You can apply ice for 20 minutes every hour. I would also recommend 600 mg Ibuprofen every 8 hours and 1,000 mg Tylenol every 8 hours as needed for pain (alternate medication every 4 hours).   You may need a repeat xray in 7-10 days to assess  for a scaphoid bone fracture however your symptoms may also be related to carpal tunnel syndrome. Please discuss this with Dr. Jordan Likes when you see him.   Return to the ED for any worsening symptoms.        Tanda Rockers, PA-C 07/17/20 1231    Linwood Dibbles, MD 07/17/20 1416

## 2020-07-17 NOTE — ED Triage Notes (Signed)
Left hand and wrist pain since yesterday. No injury.

## 2020-07-22 ENCOUNTER — Telehealth: Payer: Self-pay | Admitting: Family Medicine

## 2020-07-22 NOTE — Telephone Encounter (Signed)
Called pt to offer ED follow up appt w/ Dr. Dewitt Hoes message as no answer. -FYI.  -glh

## 2021-06-05 ENCOUNTER — Emergency Department (HOSPITAL_BASED_OUTPATIENT_CLINIC_OR_DEPARTMENT_OTHER)
Admission: EM | Admit: 2021-06-05 | Discharge: 2021-06-05 | Disposition: A | Discharge status: Home / Self Care | Payer: Medicaid Other | Attending: Emergency Medicine | Admitting: Emergency Medicine

## 2021-06-05 ENCOUNTER — Encounter (HOSPITAL_BASED_OUTPATIENT_CLINIC_OR_DEPARTMENT_OTHER): Payer: Self-pay | Admitting: Emergency Medicine

## 2021-06-05 ENCOUNTER — Other Ambulatory Visit: Payer: Self-pay

## 2021-06-05 DIAGNOSIS — F17210 Nicotine dependence, cigarettes, uncomplicated: Secondary | ICD-10-CM | POA: Insufficient documentation

## 2021-06-05 DIAGNOSIS — K0889 Other specified disorders of teeth and supporting structures: Secondary | ICD-10-CM

## 2021-06-05 DIAGNOSIS — K029 Dental caries, unspecified: Secondary | ICD-10-CM | POA: Diagnosis not present

## 2021-06-05 MED ORDER — PENICILLIN V POTASSIUM 500 MG PO TABS: 500.0000 mg | ORAL_TABLET | Freq: Four times a day (QID) | ORAL | 0 refills | Status: AC

## 2021-06-05 MED ORDER — HYDROCODONE-ACETAMINOPHEN 5-325 MG PO TABS
1.0000 | ORAL_TABLET | Freq: Once | ORAL | Status: AC
  Administered 2021-06-05: 1 via ORAL
  Filled 2021-06-05: qty 1

## 2021-06-05 MED ORDER — PENICILLIN V POTASSIUM 250 MG PO TABS
500.0000 mg | ORAL_TABLET | Freq: Once | ORAL | Status: AC
  Administered 2021-06-05: 500 mg via ORAL
  Filled 2021-06-05: qty 2

## 2021-06-05 MED ORDER — NAPROXEN 500 MG PO TABS: 500.0000 mg | ORAL_TABLET | Freq: Two times a day (BID) | ORAL | 0 refills | Status: DC

## 2021-06-05 NOTE — ED Notes (Signed)
Pt reports her mother will be driving her home

## 2021-06-05 NOTE — Discharge Instructions (Addendum)
You were evaluated in the Emergency Department and after careful evaluation, we did not find any emergent condition requiring admission or further testing in the hospital.  Your exam/testing today was overall reassuring.  Symptoms seem to be due to a tooth infection.  Please take the penicillin antibiotic as directed.  Use the Naprosyn anti-inflammatory for pain.  Recommend follow-up with a dentist.  Please return to the Emergency Department if you experience any worsening of your condition.  Thank you for allowing [place] to be a part of your care.

## 2021-06-05 NOTE — ED Triage Notes (Signed)
Pt c/o left lower dental pain.  

## 2021-06-05 NOTE — ED Provider Notes (Signed)
[facility]EMERG[facility]Y DEPT [facility] Emergency Department Provider Note MRN:  [id number]  Arrival date & time: 06/05/21     Chief Complaint   Dental Pain   History of Present Illness   Melissa Good is a 35 y.o. year-old female with no pertinent past medical history presenting to the ED with chief complaint of tooth pain.  Location: Left lower molar Duration: 1 week, worse this evening Onset: Gradual Timing: Constant pain Description: Ache Severity: Severe Exacerbating/Alleviating Factors: Worse with palpation Associated Symptoms: None Pertinent Negatives: Denies fever  Additional History: None  Review of Systems  A problem-focused ROS was performed. Positive for tooth ache.  Patient denies fever.  Patient's Health History    Past Medical History:  Diagnosis Date   Anemia    Dental caries    Headache     Past Surgical History:  Procedure Laterality Date   CHOLECYSTECTOMY     TOOTH EXTRACTION N/A 08/03/2018   Procedure: DENTAL RESTORATION/EXTRACTIONS/ THIRDS X4;  Surgeon: Ocie Doyne, DDS;  Location: [facility] OR;  Service: Oral Surgery;  Laterality: N/A;    Family History  Problem Relation Age of Onset   Hypertension Mother    Diabetes Mother    Hypertension Father     Social History   Socioeconomic History   Marital status: Single    Spouse name: Not on file   Number of children: Not on file   Years of education: Not on file   Highest education level: Not on file  Occupational History   Not on file  Tobacco Use   Smoking status: Every Day    Packs/day: 0.25    Types: Cigarettes   Smokeless tobacco: Never  Vaping Use   Vaping Use: Never used  Substance and Sexual Activity   Alcohol use: Yes    Comment: occasional   Drug use: No   Sexual activity: Not on file  Other Topics Concern   Not on file  Social History Narrative   Not on file   Social Determinants of Health   Financial Resource Strain: Not on file  Food Insecurity: Not on file   Transportation Needs: Not on file  Physical Activity: Not on file  Stress: Not on file  Social Connections: Not on file  Intimate Partner Violence: Not on file     Physical Exam   Vitals:   06/05/21 0300  BP: (!) 187/104  Pulse: 66  Resp: 18  Temp: 98.7 F (37.1 C)  SpO2: 100%    CONSTITUTIONAL: Well-appearing, NAD NEURO:  Alert and oriented x 3, no focal deficits EYES:  eyes equal and reactive ENT/NECK:  no LAD, no JVD; dental cavity noted to the left mandibular molar, mild tenderness palpation, no contiguous swelling or abscess CARDIO: Regular rate, well-perfused, normal S1 and S2 PULM:  CTAB no wheezing or rhonchi GI/GU:  normal bowel sounds, non-distended, non-tender MSK/SPINE:  No gross deformities, no edema SKIN:  no rash, atraumatic PSYCH:  Appropriate speech and behavior  *Additional and/or pertinent findings included in MDM below  Diagnostic and Interventional Summary    EKG Interpretation  Date/Time:    Ventricular Rate:    PR Interval:    QRS Duration:   QT Interval:    QTC Calculation:   R Axis:     Text Interpretation:         Labs Reviewed - No data to display  No orders to display    Medications  penicillin v potassium (VEETID) tablet 500 mg (has no administration  in time range)  HYDROcodone-acetaminophen (NORCO/VICODIN) 5-325 MG per tablet 1 tablet (has no administration in time range)     Procedures  /  Critical Care Procedures  ED Course and Medical Decision Making  I have reviewed the triage vital signs, the nursing notes, and pertinent available records from the EMR.  Listed above are laboratory and imaging tests that I personally ordered, reviewed, and interpreted and then considered in my medical decision making (see below for details).  Uncomplicated tooth infection, no systemic symptoms, no signs of serious intraoral infection, appropriate for discharge.       Elmer Sow. Pilar Plate, MD [facility] Emergency Medicine [facility] [email or web address]  Final Clinical Impressions(s) / ED Diagnoses     ICD-10-CM   1. Toothache  K08.89       ED Discharge Orders          Ordered    naproxen (NAPROSYN) 500 MG tablet  2 times daily        06/05/21 0309    penicillin v potassium (VEETID) 500 MG tablet  4 times daily        06/05/21 0309             Discharge Instructions Discussed with and Provided to Patient:    Discharge Instructions      You were evaluated in the Emergency Department and after careful evaluation, we did not find any emergent condition requiring admission or further testing in the hospital.  Your exam/testing today was overall reassuring.  Symptoms seem to be due to a tooth infection.  Please take the penicillin antibiotic as directed.  Use the Naprosyn anti-inflammatory for pain.  Recommend follow-up with a dentist.  Please return to the Emergency Department if you experience any worsening of your condition.  Thank you for allowing [place] to be a part of your care.        Sabas Sous, MD 06/05/21 [phone]

## 2021-06-05 NOTE — ED Notes (Signed)
Pt ambulatory at d/c with independent steady gait 

## 2021-07-01 IMAGING — DX DG HAND COMPLETE 3+V*L*
3 series · 3 of 3 positions shown · non-contrast
Comparison: None.

CLINICAL DATA: Left hand and wrist pain, most pronounced at the
base of thumb

EXAM:
LEFT HAND - COMPLETE 3+ VIEW; LEFT WRIST - COMPLETE 3+ VIEW

[hand pa]
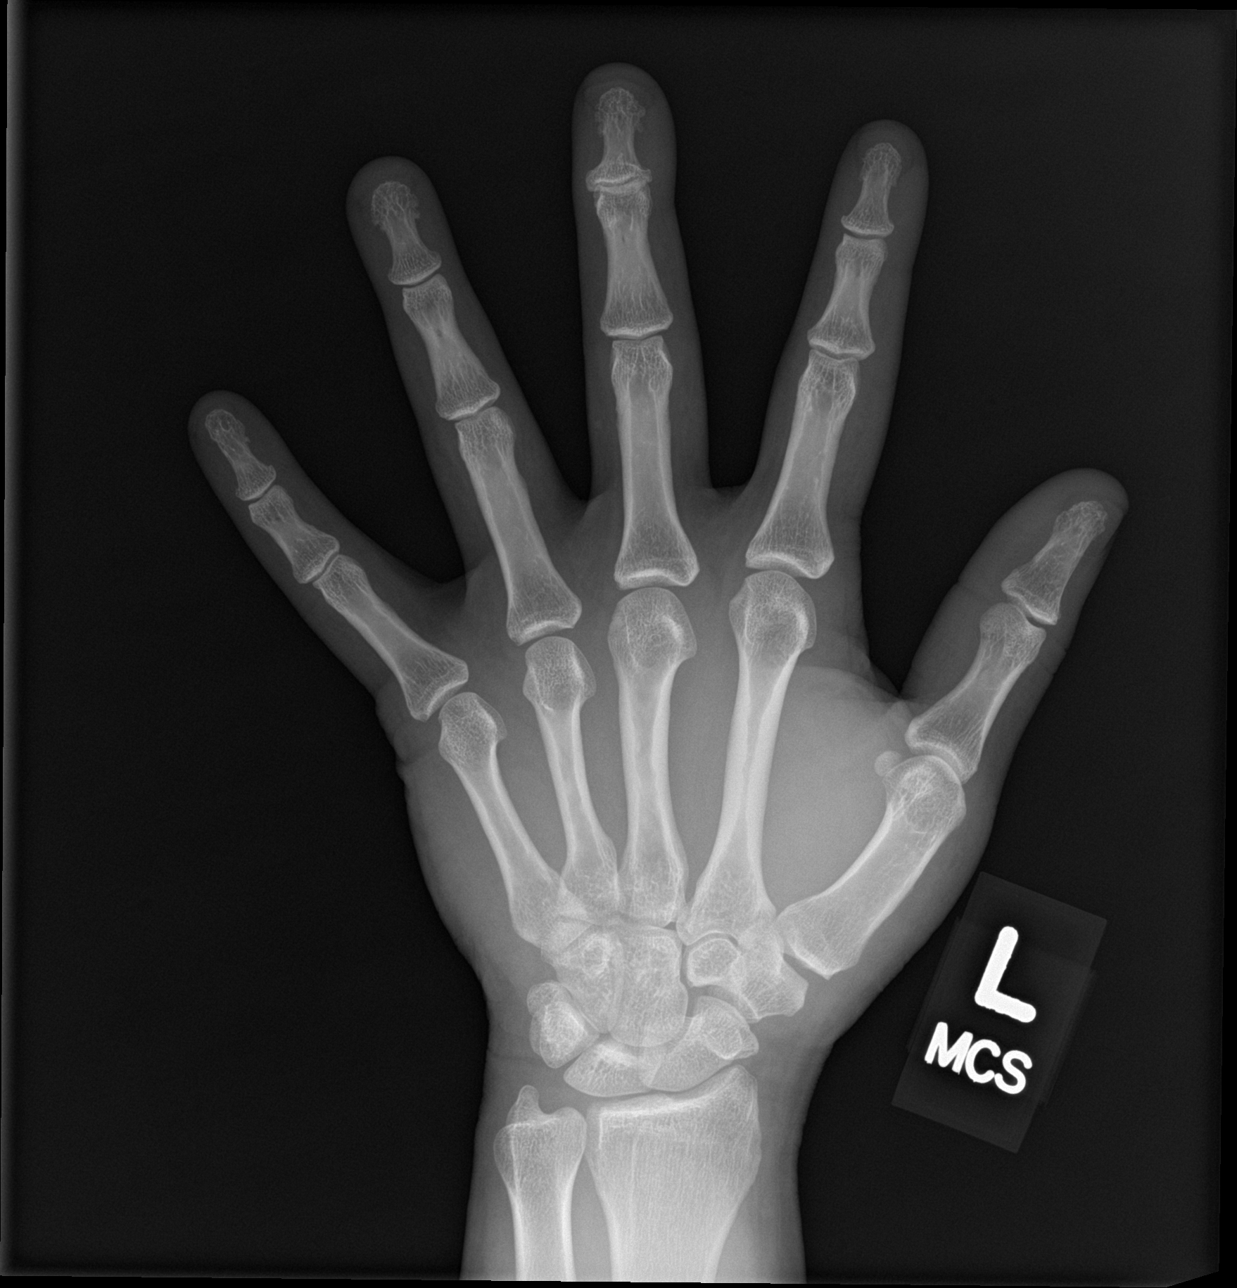

[hand obl]
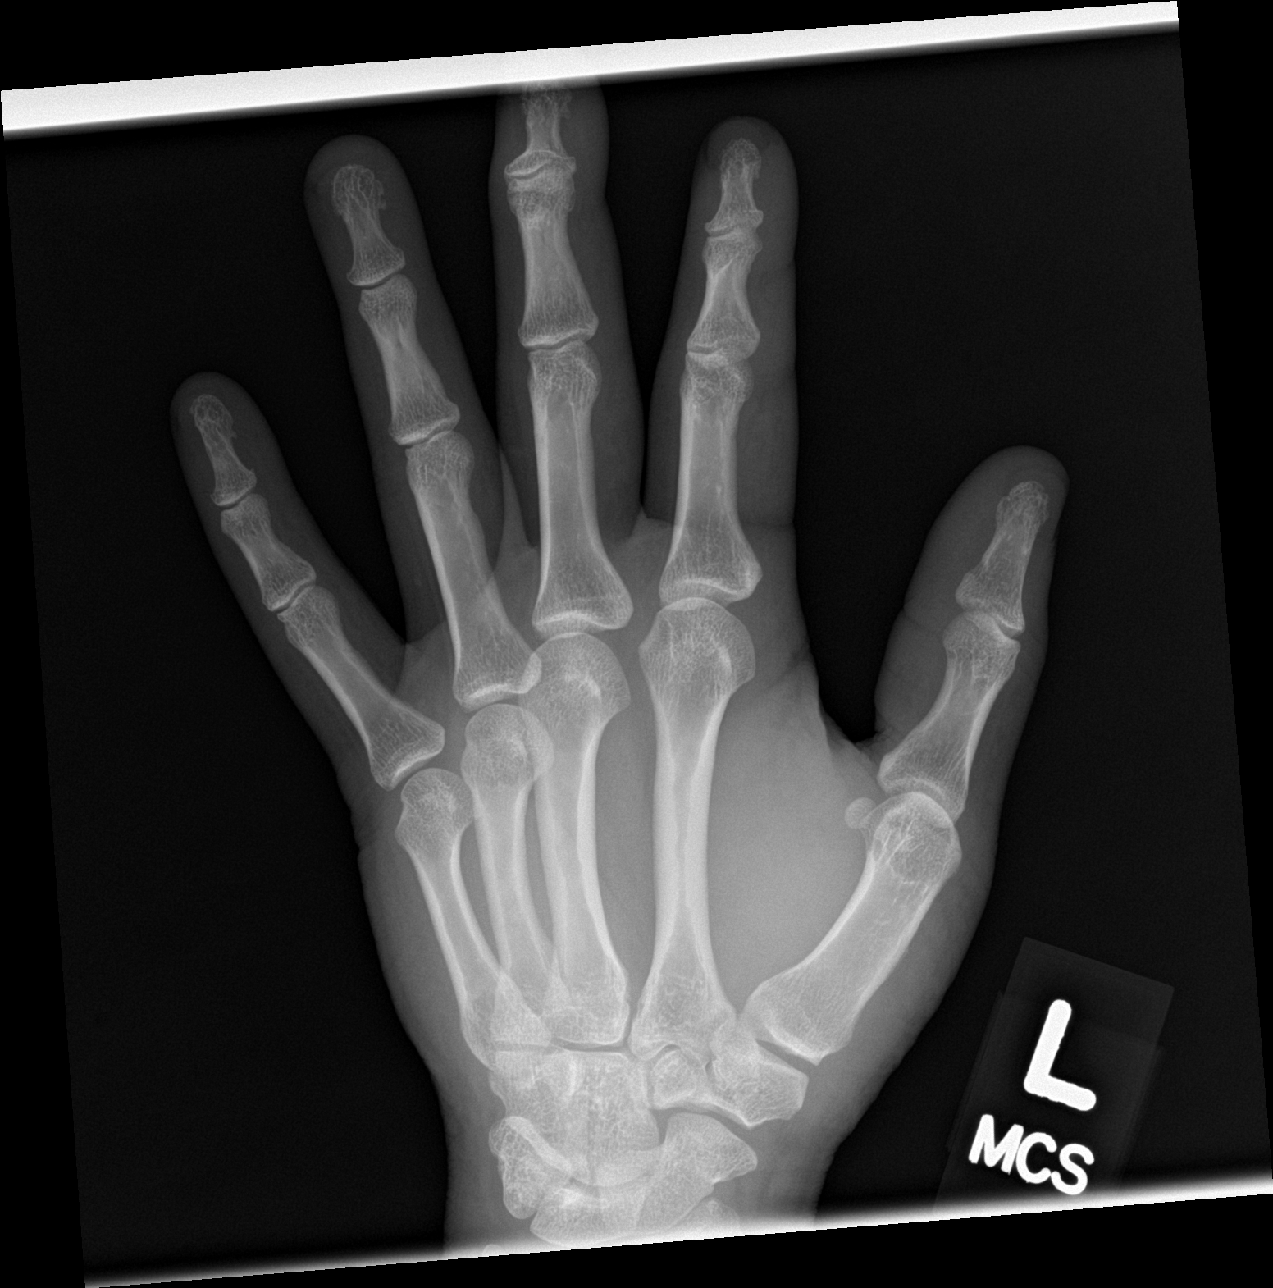

[hand lat]
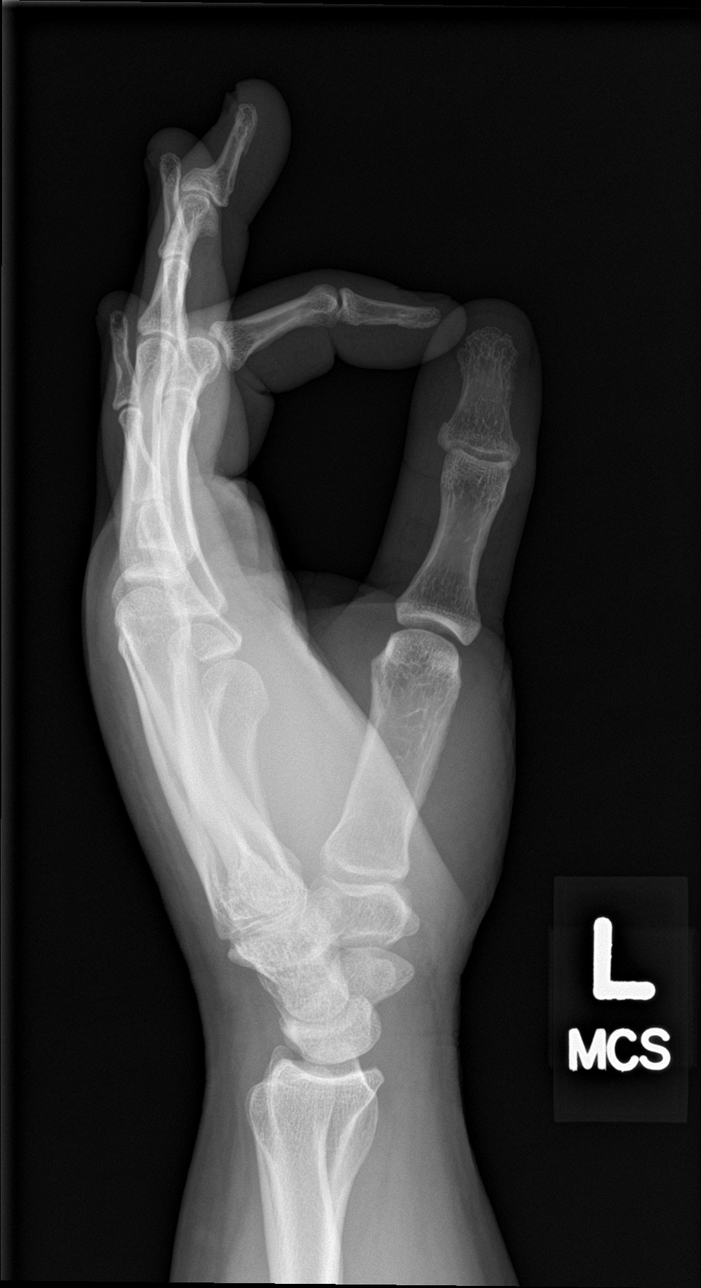

[3 of 3 positions shown; findings below may reference images not displayed]

FINDINGS: There is no evidence of acute fracture or dislocation. Chronic
healed posttraumatic deformity of the base of the long finger distal
phalanx. Joint spaces are maintained. No erosion. There is no
evidence of arthropathy or other focal bone abnormality. Soft
tissues are unremarkable.
IMPRESSION: No acute osseous abnormality of the left hand or wrist.

## 2022-01-17 ENCOUNTER — Encounter (HOSPITAL_BASED_OUTPATIENT_CLINIC_OR_DEPARTMENT_OTHER): Payer: Self-pay | Admitting: Emergency Medicine

## 2022-01-17 ENCOUNTER — Emergency Department (HOSPITAL_BASED_OUTPATIENT_CLINIC_OR_DEPARTMENT_OTHER)
Admission: EM | Admit: 2022-01-17 | Discharge: 2022-01-17 | Disposition: A | Discharge status: Home / Self Care | Payer: Medicaid Other | Attending: Emergency Medicine | Admitting: Emergency Medicine

## 2022-01-17 ENCOUNTER — Emergency Department (HOSPITAL_BASED_OUTPATIENT_CLINIC_OR_DEPARTMENT_OTHER): Payer: Medicaid Other

## 2022-01-17 ENCOUNTER — Other Ambulatory Visit: Payer: Self-pay

## 2022-01-17 DIAGNOSIS — W232XXA Caught, crushed, jammed or pinched between a moving and stationary object, initial encounter: Secondary | ICD-10-CM | POA: Insufficient documentation

## 2022-01-17 DIAGNOSIS — S63635A Sprain of interphalangeal joint of left ring finger, initial encounter: Secondary | ICD-10-CM | POA: Diagnosis not present

## 2022-01-17 DIAGNOSIS — Y9367 Activity, basketball: Secondary | ICD-10-CM | POA: Diagnosis not present

## 2022-01-17 DIAGNOSIS — S6992XA Unspecified injury of left wrist, hand and finger(s), initial encounter: Secondary | ICD-10-CM | POA: Diagnosis present

## 2022-01-17 MED ORDER — ACETAMINOPHEN 325 MG PO TABS
650.0000 mg | ORAL_TABLET | Freq: Once | ORAL | Status: AC
  Administered 2022-01-17: 650 mg via ORAL
  Filled 2022-01-17: qty 2

## 2022-01-17 MED ORDER — IBUPROFEN 400 MG PO TABS
600.0000 mg | ORAL_TABLET | Freq: Once | ORAL | Status: AC
  Administered 2022-01-17: 600 mg via ORAL
  Filled 2022-01-17: qty 1

## 2022-01-17 MED ORDER — NAPROXEN 500 MG PO TABS: 500.0000 mg | ORAL_TABLET | Freq: Two times a day (BID) | ORAL | 0 refills | Status: DC

## 2022-01-17 NOTE — ED Provider Notes (Signed)
?[facility] EMERGENCY DEPARTMENT ?Provider Note ? ? ?CSN: [id number] ?Arrival date & time: 01/17/22  [id number] ? ?  ? ?History ? ?Chief Complaint  ?Patient presents with  ? Hand Pain  ? ? ?Melissa Good is a 36 y.o. female presenting to the ED with a chief complaint of left fourth digit pain. Describes the pain as aching, worse with movement and palpation. She was playing basketball yesterday afternoon when she felt as though she jammed the finger.  She was also having pain in her left third and fifth digits although this has improved.  She took ibuprofen yesterday to help with symptoms.  She has not taken any medicine today.  States that she had a previous injury in her left first digit several years ago.  Denies any fracture, dislocation or procedure in her hand otherwise.  Denies any other injuries.  No numbness, weakness. No pain prior to this incident yesterday. ? ? ?Hand Pain ? ? ?  ? ?Home Medications ?Prior to Admission medications   ?Medication Sig Start Date End Date Taking? Authorizing Provider  ?naproxen (NAPROSYN) 500 MG tablet Take 1 tablet (500 mg total) by mouth 2 (two) times daily. 01/17/22  Yes Vernard Gram, PA-C  ?amoxicillin (AMOXIL) 500 MG capsule Take 1 capsule (500 mg total) by mouth 3 (three) times daily. 08/03/18   Ocie DoyneJensen, Scott, DMD  ?HYDROcodone-acetaminophen (NORCO) 5-325 MG tablet Take 1 tablet by mouth every 6 (six) hours as needed for moderate pain. 08/03/18   Ocie DoyneJensen, Scott, DMD  ?terbinafine (LAMISIL) 250 MG tablet Take 1 tablet by mouth daily. 05/01/18   [provider]  ?   ? ?Allergies    ?Patient has no known allergies.   ? ?Review of Systems   ?Review of Systems  ?Constitutional:  Negative for chills and fever.  ?Musculoskeletal:  Positive for arthralgias. Negative for myalgias.  ?Neurological:  Negative for weakness and numbness.  ? ?Physical Exam ?Updated Vital Signs ?BP (!) 136/99 (BP Location: Right Arm)   Pulse 73   Resp 15   SpO2 100%  ?Physical Exam ?Vitals  and nursing note reviewed.  ?Constitutional:   ?   General: She is not in acute distress. ?   Appearance: She is well-developed. She is not diaphoretic.  ?HENT:  ?   Head: Normocephalic and atraumatic.  ?Eyes:  ?   General: No scleral icterus. ?   Conjunctiva/sclera: Conjunctivae normal.  ?Pulmonary:  ?   Effort: Pulmonary effort is normal. No respiratory distress.  ?Musculoskeletal:     ?   General: Tenderness present.  ?   Cervical back: Normal range of motion.  ?   Comments: TTP of the PIP joint of the L 4th digit. Digit held in flexion but able to passively move this digit although reports pain.  Normal sensation to light touch of all digits.  No gross deformities.  2+ radial pulse palpated bilaterally.  Normal range of motion of remainder of digits of left hand.  No wounds, erythema or edema of joint  ?Skin: ?   Findings: No rash.  ?Neurological:  ?   Mental Status: She is alert.  ? ? ?ED Results / Procedures / Treatments   ?Labs ?(all labs ordered are listed, but only abnormal results are displayed) ?Labs Reviewed - No data to display ? ?EKG ?None ? ?Radiology ?DG Hand Complete Left ? ?Result Date: 01/17/2022 ?CLINICAL DATA:  Trauma, pain and swelling EXAM: LEFT HAND - COMPLETE 3+ VIEW COMPARISON:  None. FINDINGS: No recent  displaced fracture or dislocation is seen. Bony spur is seen in the dorsal aspect of distal phalanx of middle finger. There is 1 mm smooth marginated calcification adjacent to distal shaft of distal phalanx of fifth finger. Small bony spurs seen in first carpometacarpal joint. IMPRESSION: No recent fracture or dislocation is seen. 1 mm smooth marginated calcification adjacent to the distal phalanx of fifth finger may be residual from previous injury. Bony spurs seen in the dorsal aspect of base of distal phalanx of left middle finger which may be residual from previous injury or bony spur. Electronically Signed   By: Ernie Avena M.D.   On: 01/17/2022 09:19   ? ?Procedures ?Procedures   ? ? ?Medications Ordered in ED ?Medications  ?acetaminophen (TYLENOL) tablet 650 mg (650 mg Oral Given 01/17/22 0936)  ?ibuprofen (ADVIL) tablet 600 mg (600 mg Oral Given 01/17/22 0936)  ? ? ?ED Course/ Medical Decision Making/ A&P ?Clinical Course as of 01/17/22 0949  ?Mon Jan 17, 2022  ?0926 Oral temp 98.1 [HK]  ?  ?Clinical Course User Index ?[HK] Idelle Leech, Sarae Nicholes, PA-C  ? ?                        ?Medical Decision Making ?Amount and/or Complexity of Data Reviewed ?Radiology: ordered. ? ?Risk ?OTC drugs. ? ? ?36 year old female presenting to the ED with a chief complaint of left fourth digit PIP joint pain.  States that she jammed it while playing basketball yesterday.  She also experienced pain in her left third and fifth digits although this has improved since waking up this morning.  On exam I am able to passively range the fourth digit although she does report significant pain with this.  Area is neurovascularly intact.  She has normal range of motion of the remainder of her digits.  She has no gross deformities noted.  She had no pain prior to this incident.  X-ray shows no acute abnormality.  There are findings consistent with bone spurs versus residual findings from prior injuries although nothing significantly seen on this fourth digit.  I suspect symptoms are due to a sprain.  I doubt infectious or vascular cause of her symptoms based on physical exam findings and mechanism of injury.  Will treat with finger splint and NSAIDs.  We will have her follow-up with PCP.  Return precautions given. ? ?All imaging, if done today, including plain films, CT scans, and ultrasounds, independently reviewed by me, and interpretations confirmed via formal radiology reads. ? ?Patient is hemodynamically stable, in NAD, and able to ambulate in the ED. Evaluation does not show pathology that would require ongoing emergent intervention or inpatient treatment. I explained the diagnosis to the patient. Pain has been managed and has  no complaints prior to discharge. Patient is comfortable with above plan and is stable for discharge at this time. All questions were answered prior to disposition. Strict return precautions for returning to the ED were discussed. Encouraged follow up with PCP.  ? ?An After Visit Summary was printed and given to the patient. ? ? ?Portions of this note were generated with Scientist, clinical (histocompatibility and immunogenetics). Dictation errors may occur despite best attempts at proofreading. ? ? ? ? ? ? ? ? ?Final Clinical Impression(s) / ED Diagnoses ?Final diagnoses:  ?Sprain of interphalangeal joint of left ring finger, initial encounter  ? ? ?Rx / DC Orders ?ED Discharge Orders   ? ?      Ordered  ?  naproxen (  NAPROSYN) 500 MG tablet  2 times daily       ? 01/17/22 0946  ? ?  ?  ? ?  ? ? ?  Dietrich Pates, PA-C ?01/17/22 0355 ? ?  ?Virgina Norfolk, DO ?01/17/22 9741 ? ?

## 2022-01-17 NOTE — ED Triage Notes (Signed)
Pt reports jamming left middle, ring, and pinky fingers while playing basketball yesterday. ?

## 2022-01-17 NOTE — Discharge Instructions (Addendum)
Take medications to help with your pain. ?Wear the finger splint to help with immobilization and to heal this apparent sprain. ?There is no evidence on your x-ray that there is any bony abnormalities or fractures. ?Follow-up with your primary care provider. ?Return to the ER if you start to experience worsening symptoms, additional injuries, numbness, increased swelling or warmth of your joints ?

## 2022-02-21 ENCOUNTER — Other Ambulatory Visit: Payer: Self-pay

## 2022-02-21 ENCOUNTER — Emergency Department (HOSPITAL_BASED_OUTPATIENT_CLINIC_OR_DEPARTMENT_OTHER)
Admission: EM | Admit: 2022-02-21 | Discharge: 2022-02-21 | Disposition: A | Discharge status: Home / Self Care | Payer: Medicaid Other | Attending: Emergency Medicine | Admitting: Emergency Medicine

## 2022-02-21 ENCOUNTER — Encounter (HOSPITAL_BASED_OUTPATIENT_CLINIC_OR_DEPARTMENT_OTHER): Payer: Self-pay | Admitting: Emergency Medicine

## 2022-02-21 DIAGNOSIS — R0789 Other chest pain: Secondary | ICD-10-CM | POA: Diagnosis present

## 2022-02-21 LAB — TROPONIN I (HIGH SENSITIVITY): Troponin I (High Sensitivity): <2 ng/L (ref <18)

## 2022-02-21 MED ORDER — IBUPROFEN 400 MG PO TABS
600.0000 mg | ORAL_TABLET | Freq: Once | ORAL | Status: AC
  Administered 2022-02-21: 600 mg via ORAL
  Filled 2022-02-21: qty 1

## 2022-02-21 NOTE — ED Provider Notes (Signed)
[place] [facility] EMERGENCY DEPARTMENT Provider Note   CSN: [id number] Arrival date & time: 02/21/22  1044     History  Chief Complaint  Patient presents with   Chest Pain    Melissa Good is a 36 y.o. female with no past medical history presenting today with a complaint of right-sided chest pain.  Says that it started while she was at work and reaching up to lift something off a shelf.  Describes it as throbbing and feels as though something is "pulling" when she reaches up or rotates her shoulders.  Says it does not radiate anywhere.  No history of ACS.  Says that she waited an hour but since it did not go away she came to the emergency department.  No at home treatment attempted.  Works in Ambulance person labor  Chest Pain     Home Medications Prior to Admission medications   Medication Sig Start Date End Date Taking? Authorizing Provider  amoxicillin (AMOXIL) 500 MG capsule Take 1 capsule (500 mg total) by mouth 3 (three) times daily. 08/03/18   Diona Browner, DMD  HYDROcodone-acetaminophen (NORCO) 5-325 MG tablet Take 1 tablet by mouth every 6 (six) hours as needed for moderate pain. 08/03/18   Diona Browner, DMD  naproxen (NAPROSYN) 500 MG tablet Take 1 tablet (500 mg total) by mouth 2 (two) times daily. 01/17/22   Khatri, Hina, PA-C  terbinafine (LAMISIL) 250 MG tablet Take 1 tablet by mouth daily. 05/01/18   [provider]      Allergies    Patient has no known allergies.    Review of Systems   Review of Systems  Cardiovascular:  Positive for chest pain.   Physical Exam Updated Vital Signs BP (!) 126/91   Pulse 75   Temp 98.4 F (36.9 C) (Oral)   Resp 17   Ht 5\' 4"  (1.626 m)   Wt 77.1 kg   SpO2 100%   BMI 29.18 kg/m  Physical Exam Vitals and nursing note reviewed.  Constitutional:      General: She is not in acute distress.    Appearance: Normal appearance. She is not ill-appearing.  HENT:     Head: Normocephalic and atraumatic.  Eyes:     General:  No scleral icterus.    Conjunctiva/sclera: Conjunctivae normal.  Cardiovascular:     Rate and Rhythm: Normal rate and regular rhythm.  Pulmonary:     Effort: Pulmonary effort is normal. No respiratory distress.     Breath sounds: Normal breath sounds.  Chest:     Chest wall: Tenderness (Anterior chest wall, between ribs 2 and 3) present.  Skin:    General: Skin is warm and dry.     Findings: No rash.  Neurological:     Mental Status: She is alert.  Psychiatric:        Mood and Affect: Mood normal.    ED Results / Procedures / Treatments   Labs (all labs ordered are listed, but only abnormal results are displayed) Labs Reviewed  TROPONIN I (HIGH SENSITIVITY)    EKG None  Radiology No results found.  Procedures Procedures   Medications Ordered in ED Medications  ibuprofen (ADVIL) tablet 600 mg (600 mg Oral Given 02/21/22 1151)    ED Course/ Medical Decision Making/ A&P                           Medical Decision Making  This patient presents to the ED  for concern of chest pain. The emergent differential diagnosis of chest pain includes: Acute coronary syndrome, pericarditis, aortic dissection, pulmonary embolism, tension pneumothorax, and esophageal rupture.    This is not an exhaustive differential.    Past Medical History / Co-morbidities / Social History: None    Physical Exam: Physical exam performed. The pertinent findings include: Reproducible pain to the anterior midline intercostal muscles of ribs 2 and 3  Lab Tests: I ordered, and interpreted troponin.  Negative   Imaging Studies: Low suspicion cardiopulmonary, auscultation negative.  No imaging  Cardiac Monitoring:  The patient was maintained on a cardiac monitor.  My attending physician Tyrone Nine viewed and interpreted the cardiac monitored which showed an underlying rhythm of: Normal sinus rhythm   Medications: I ordered medication including ibuprofen. Reevaluation of the patient after these  medicines showed that the patient improved. I have reviewed the patients home medicines and have made adjustments as needed.   Disposition: This is a 36 year old female presenting with right-sided chest pain that began after she was reaching for an item on a shelf.  Reproducible on physical exam.  Heart score 0.  After consideration of the diagnostic results and the patients response to treatment, I feel that patient is stable for discharge home.  Low suspicion cardiac cause of her discomfort.  More likely a intercostal muscle strain that improved with ibuprofen.  She may continue NSAID and Tylenol therapy at home.  She is agreeable and discharged in ambulatory condition    Final Clinical Impression(s) / ED Diagnoses Final diagnoses:  Chest wall pain    Rx / DC Orders   Results and diagnoses were explained to the patient. Return precautions discussed in full. Patient had no additional questions and expressed complete understanding.   This chart was dictated using voice recognition software.  Despite best efforts to proofread,  errors can occur which can change the documentation meaning.    Rhae Hammock, PA-C 02/21/22 [place], [place], DO 02/21/22 1431

## 2022-02-21 NOTE — ED Triage Notes (Signed)
Patient presents to ED via POV from work. Reports right sided chest pain that began PTA. Constant pain. Reports pain is worse with movement. Describes the pain as pulling. Denies nausea, vomiting or SOB.

## 2022-02-21 NOTE — Discharge Instructions (Addendum)
Alternate ibuprofen and Tylenol for your pain.  You may do either heating pads or ice packs, whichever makes you feel better.  Currently I recommend heat pads.  Your work note is attached, please return with any worsening or recurring symptoms

## 2022-09-01 ENCOUNTER — Ambulatory Visit (INDEPENDENT_AMBULATORY_CARE_PROVIDER_SITE_OTHER): Payer: Medicaid Other

## 2022-09-01 ENCOUNTER — Encounter (HOSPITAL_COMMUNITY): Payer: Self-pay | Admitting: *Deleted

## 2022-09-01 ENCOUNTER — Ambulatory Visit (HOSPITAL_COMMUNITY)
Admission: EM | Admit: 2022-09-01 | Discharge: 2022-09-01 | Disposition: A | Discharge status: Home / Self Care | Payer: Medicaid Other | Attending: Sports Medicine | Admitting: Sports Medicine

## 2022-09-01 DIAGNOSIS — M25571 Pain in right ankle and joints of right foot: Secondary | ICD-10-CM

## 2022-09-01 MED ORDER — IBUPROFEN 800 MG PO TABS: 800.0000 mg | ORAL_TABLET | Freq: Three times a day (TID) | ORAL | 0 refills | Status: AC

## 2022-09-01 NOTE — ED Notes (Signed)
No answer in lobby.

## 2022-09-01 NOTE — Discharge Instructions (Signed)
Your ankle x-rays were negative for any new fracture or break.  I recommend you use the ankle brace for the next couple weeks till you are feeling stronger and have all of your motion back.  You may continue with ice elevation and the anti-inflammatory, ibuprofen that I sent to your pharmacy 3 times a day with meals and lots of water.  Return to your primary care provider or urgent care if your symptoms do not improve over the next 7 to 14 days.

## 2022-09-01 NOTE — ED Triage Notes (Signed)
Pt states she was getting out of the truck at work and a box fell on her right ankle this morning it was swollen but has went down but still hurts to put pressure. She took some IBU 800mg  today.

## 2022-09-01 NOTE — ED Provider Notes (Signed)
[facility]    CSN: [id number] Arrival date & time: 09/01/22  1118      History   Chief Complaint Chief Complaint  Patient presents with   Ankle Pain    HPI Melissa Good is a 36 y.o. female.   She presents today with chief complaint of right ankle pain since this morning around 2 or 3 AM.  She reports she stepped out of her work truck and some boxes fell off and landed on her lateral ankle.  She had immediate pain and swelling with some difficulty walking.  She has taken 800 mg of ibuprofen and elevating the area which she reports has helped a little bit but she is still having some pain which is causing her to limp.  She initially presented to the ER where she waited 3 hours without being seen so she came to the urgent care as her pain did not resolve.  She has a previous history of tearing to ligaments in this ankle in the past.  She denies any numbness or tingling into her toes, or pain at the base of the fifth.   Ankle Pain   Past Medical History:  Diagnosis Date   Anemia    Dental caries    Headache     There are no problems to display for this patient.   Past Surgical History:  Procedure Laterality Date   CHOLECYSTECTOMY     TOOTH EXTRACTION N/A 08/03/2018   Procedure: DENTAL RESTORATION/EXTRACTIONS/ THIRDS X4;  Surgeon: Ocie Doyne, DDS;  Location: [facility] OR;  Service: Oral Surgery;  Laterality: N/A;    OB History   No obstetric history on file.      Home Medications    Prior to Admission medications   Medication Sig Start Date End Date Taking? Authorizing Provider  levonorgestrel (MIRENA) 20 MCG/DAY IUD Mirena 20 mcg/24 hours (5 yrs) 52 mg intrauterine device  Take by intrauterine route. 07/09/13  Yes [provider]  amoxicillin (AMOXIL) 500 MG capsule Take 1 capsule (500 mg total) by mouth 3 (three) times daily. 08/03/18   Ocie Doyne, DMD  HYDROcodone-acetaminophen (NORCO) 5-325 MG tablet Take 1 tablet by mouth every 6 (six) hours  as needed for moderate pain. 08/03/18   Ocie Doyne, DMD  naproxen (NAPROSYN) 500 MG tablet Take 1 tablet (500 mg total) by mouth 2 (two) times daily. 01/17/22   Khatri, Hina, PA-C  terbinafine (LAMISIL) 250 MG tablet Take 1 tablet by mouth daily. 05/01/18   [provider]    Family History Family History  Problem Relation Age of Onset   Hypertension Mother    Diabetes Mother    Hypertension Father     Social History Social History   Tobacco Use   Smoking status: Every Day    Packs/day: 0.25    Types: Cigarettes   Smokeless tobacco: Never  Vaping Use   Vaping Use: Never used  Substance Use Topics   Alcohol use: Yes    Comment: occasional   Drug use: No     Allergies   Patient has no known allergies.   Review of Systems Review of Systems As listed above in HPI  Physical Exam Triage Vital Signs ED Triage Vitals  Enc Vitals Group     BP 09/01/22 1311 121/81     Pulse Rate 09/01/22 1311 85     Resp 09/01/22 1311 18     Temp 09/01/22 1311 98.2 F (36.8 C)     Temp Source 09/01/22  1311 Oral     SpO2 09/01/22 1311 98 %     Weight --      Height --      Head Circumference --      Peak Flow --      Pain Score 09/01/22 1310 6     Pain Loc --      Pain Edu? --      Excl. in GC? --    No data found.  Updated Vital Signs BP 121/81 (BP Location: Left Arm)   Pulse 85   Temp 98.2 F (36.8 C) (Oral)   Resp 18   SpO2 98%   Physical Exam Vitals reviewed.  Constitutional:      General: She is not in acute distress.    Appearance: Normal appearance. She is not ill-appearing, toxic-appearing or diaphoretic.  Cardiovascular:     Rate and Rhythm: Normal rate.  Pulmonary:     Effort: Pulmonary effort is normal.  Musculoskeletal:     Comments: Right ankle: No obvious deformity or asymmetry.  No swelling or ecchymosis present today.  She has tenderness to palpation in the anterior portion of the lateral malleolus and anterior portion of the medial  malleolus.  Full range of motion plantarflexion, dorsiflexion, inversion and eversion however at the extremes of dorsiflexion inversion eversion she has some discomfort.  Dorsalis pedis 2+. Antalgic gait  Neurological:     Mental Status: She is alert.      UC Treatments / Results  Labs (all labs ordered are listed, but only abnormal results are displayed) Labs Reviewed - No data to display  EKG   Radiology DG Ankle Complete Right  Result Date: 09/01/2022 CLINICAL DATA:  Pain EXAM: RIGHT ANKLE - COMPLETE 3 VIEW COMPARISON:  Right ankle radiograph dated June 09, 2012 FINDINGS: No evidence of fracture, dislocation, or joint effusion. Mild cortical irregularity of the distal fibula, likely sequela prior fracture. Mild degenerative changes of the lateral malleolus. Soft tissues are unremarkable. IMPRESSION: No evidence of fracture or dislocation. Electronically Signed   By: Allegra Lai M.D.   On: 09/01/2022 13:37    Procedures Procedures (including critical care time)  Medications Ordered in UC Medications - No data to display  Initial Impression / Assessment and Plan / UC Course  I have reviewed the triage vital signs and the nursing notes.  Pertinent labs & imaging results that were available during my care of the patient were reviewed by me and considered in my medical decision making (see chart for details).     Ankle pain after inversion injury.  X-rays of the right ankle revealed no acute fracture or osseous abnormality.  There is evidence of old distal fibular cortical irregularity and mild degenerative changes of the lateral malleolus.  Will place her in a stirrup brace for the next week or 2 until her pain improves.  I also have sent 800 mg ibuprofen to her pharmacy for some pain relief.  I recommend she continue with ice and elevation.  I also gave her exercises to begin. Final Clinical Impressions(s) / UC Diagnoses   Final diagnoses:  None   Discharge  Instructions   None    ED Prescriptions   None    PDMP not reviewed this encounter.   Claudie Leach, DO 09/01/22 1358

## 2022-09-15 ENCOUNTER — Ambulatory Visit: Payer: Medicaid Other | Admitting: Family

## 2022-09-15 ENCOUNTER — Other Ambulatory Visit: Payer: Self-pay | Admitting: Family

## 2023-01-01 IMAGING — CR DG HAND COMPLETE 3+V*L*
3 series · 3 of 3 positions shown · non-contrast
Comparison: None.

CLINICAL DATA: Trauma, pain and swelling

EXAM:
LEFT HAND - COMPLETE 3+ VIEW

[x hand pa left]
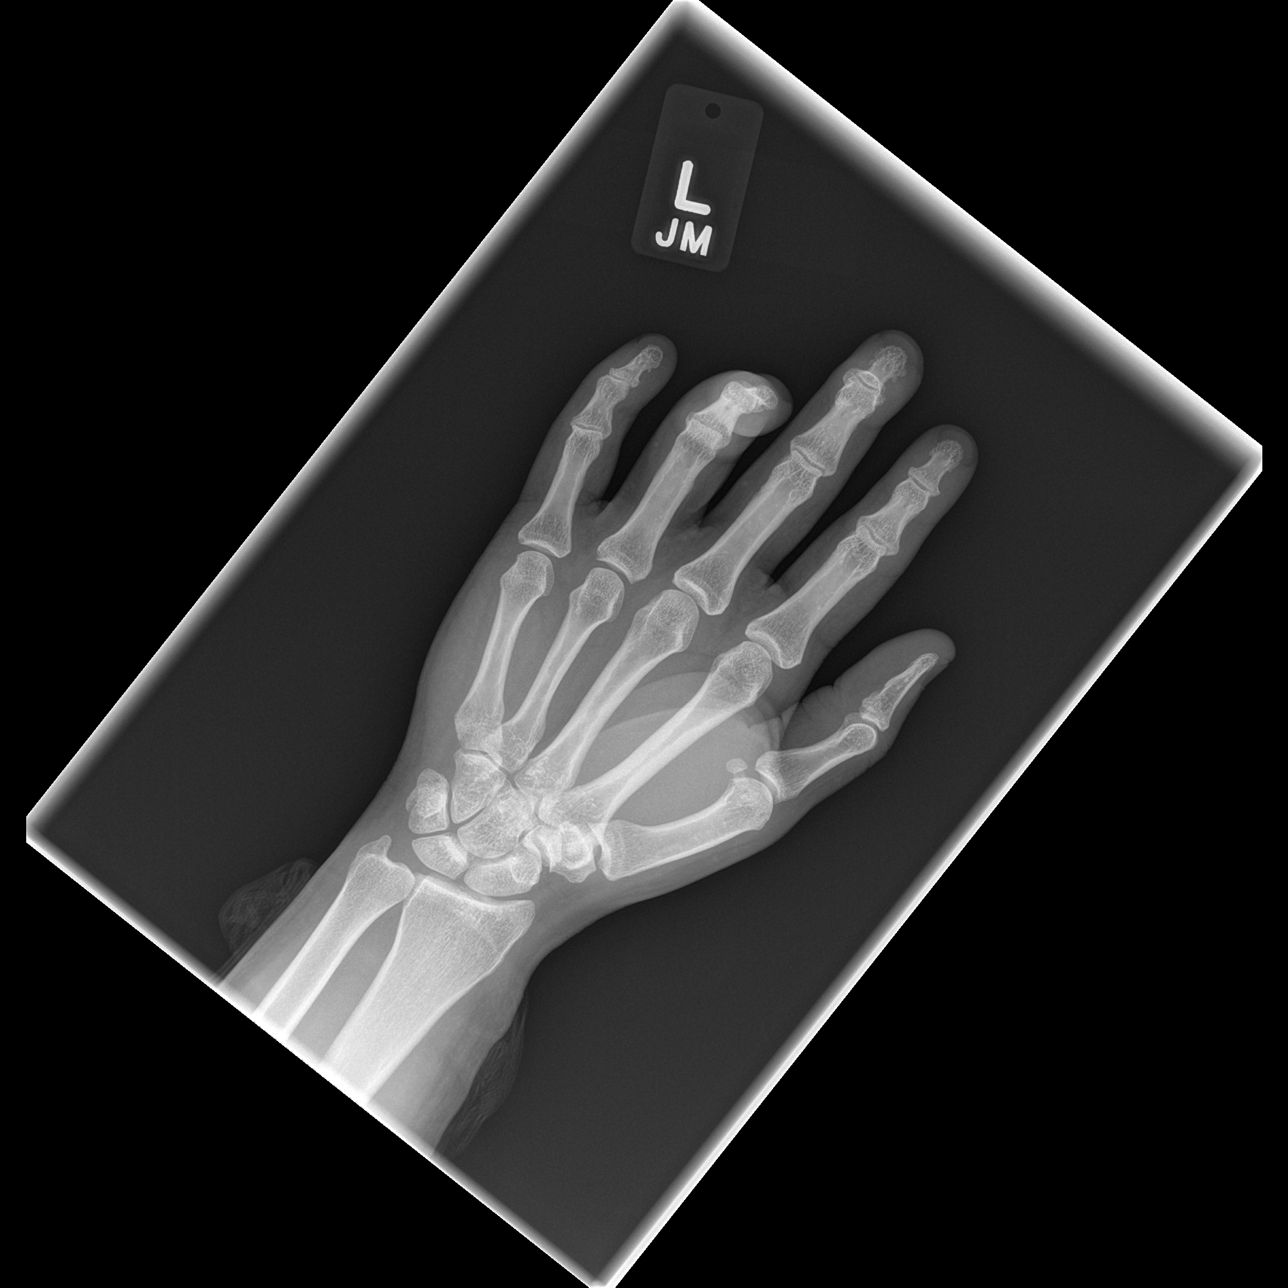

[x hand oblique left]
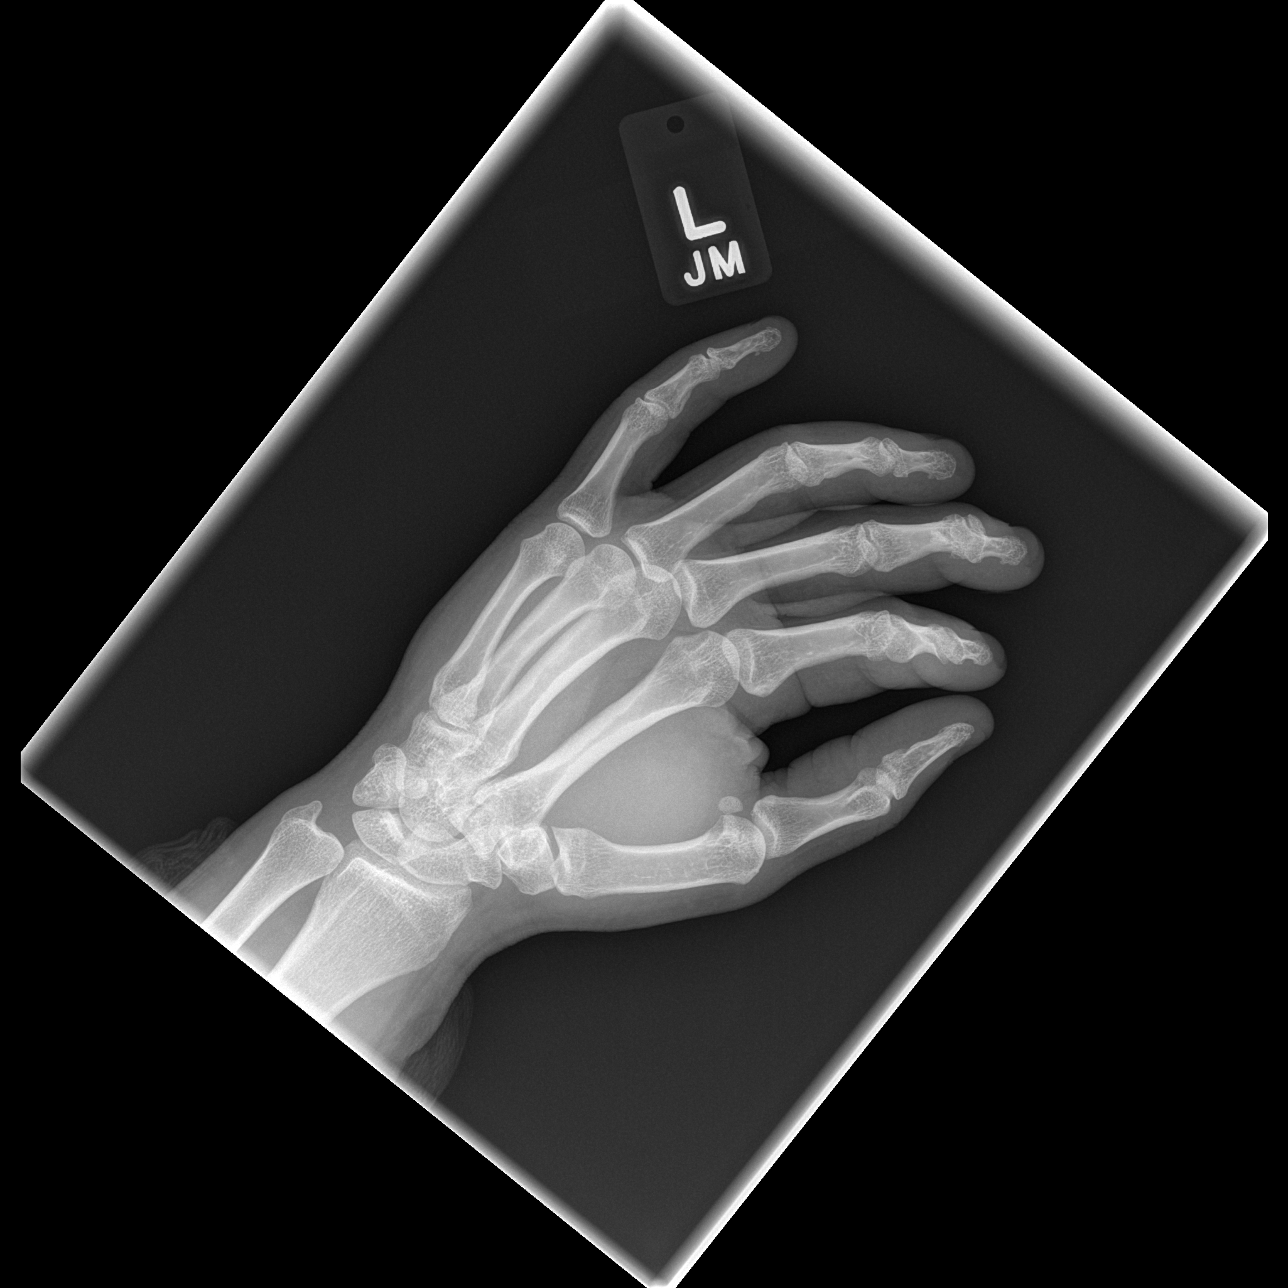

[x hand lat left]
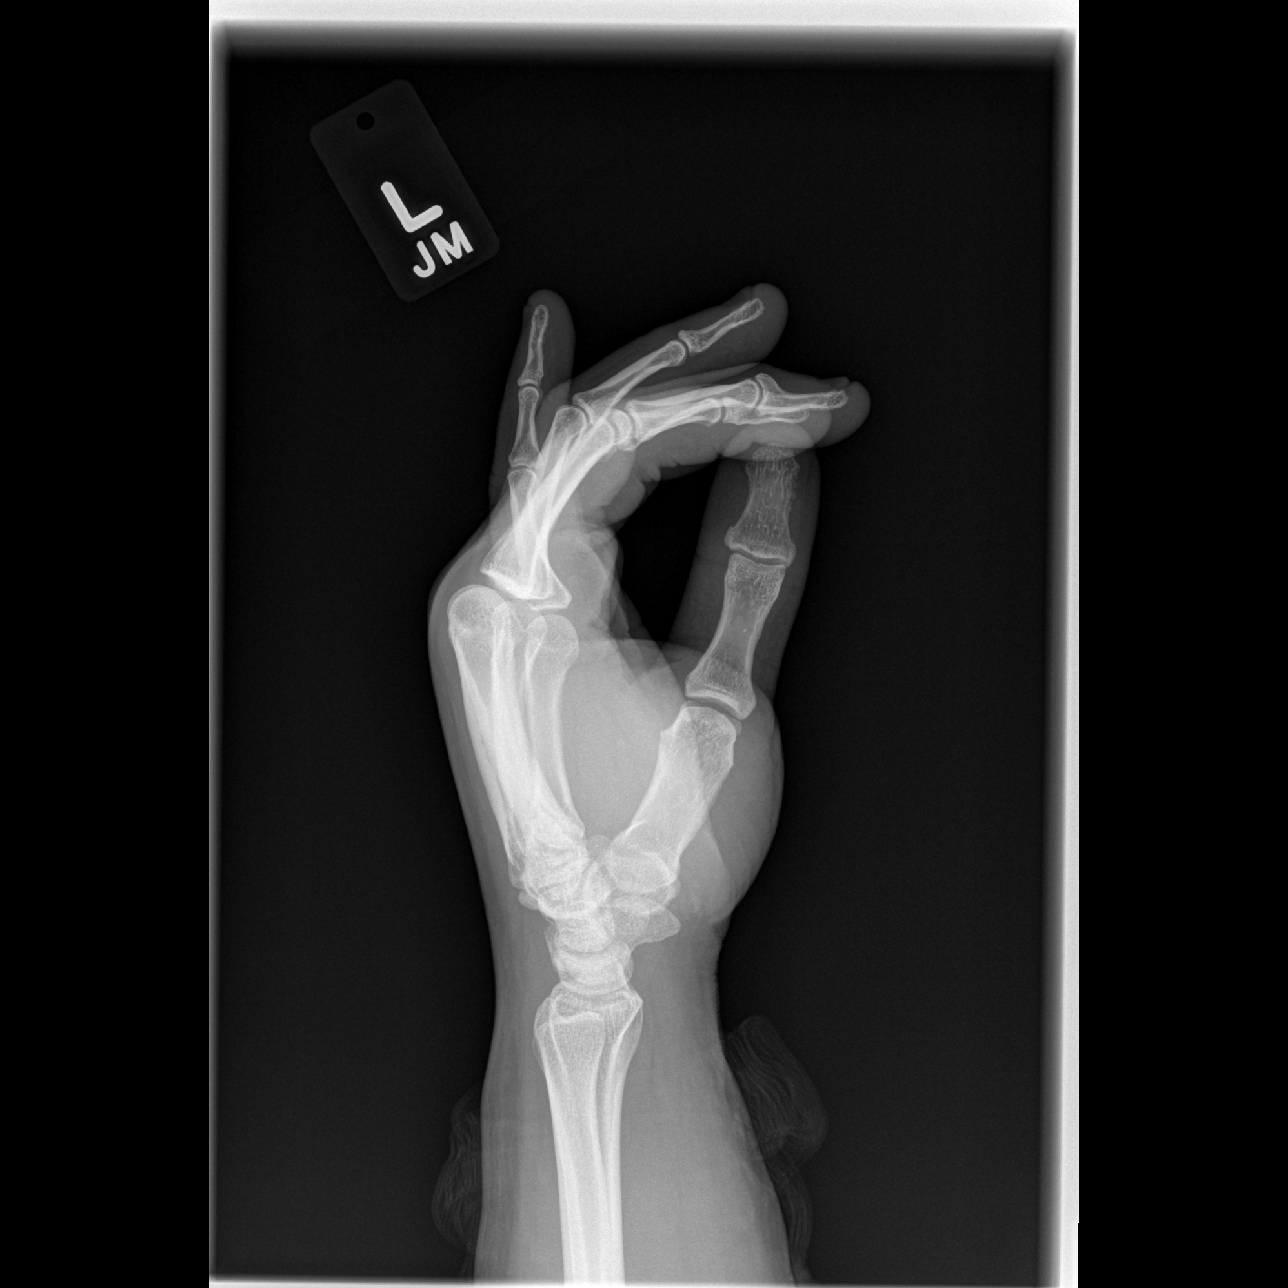

[3 of 3 positions shown; findings below may reference images not displayed]

FINDINGS: No recent displaced fracture or dislocation is seen. Bony spur is
seen in the dorsal aspect of distal phalanx of middle finger. There
is 1 mm smooth marginated calcification adjacent to distal shaft of
distal phalanx of fifth finger. Small bony spurs seen in first
carpometacarpal joint.
IMPRESSION: No recent fracture or dislocation is seen. 1 mm smooth marginated
calcification adjacent to the distal phalanx of fifth finger may be
residual from previous injury. Bony spurs seen in the dorsal aspect
of base of distal phalanx of left middle finger which may be
residual from previous injury or bony spur.

## 2023-01-17 ENCOUNTER — Encounter: Payer: Self-pay | Admitting: *Deleted

## 2024-03-07 ENCOUNTER — Other Ambulatory Visit: Payer: Self-pay

## 2024-03-07 ENCOUNTER — Emergency Department (HOSPITAL_BASED_OUTPATIENT_CLINIC_OR_DEPARTMENT_OTHER)

## 2024-03-07 ENCOUNTER — Encounter (HOSPITAL_BASED_OUTPATIENT_CLINIC_OR_DEPARTMENT_OTHER): Payer: Self-pay | Admitting: Emergency Medicine

## 2024-03-07 ENCOUNTER — Emergency Department (HOSPITAL_BASED_OUTPATIENT_CLINIC_OR_DEPARTMENT_OTHER)
Admission: EM | Admit: 2024-03-07 | Discharge: 2024-03-07 | Disposition: A | Discharge status: Home / Self Care | Attending: Emergency Medicine | Admitting: Emergency Medicine

## 2024-03-07 DIAGNOSIS — M25561 Pain in right knee: Secondary | ICD-10-CM | POA: Diagnosis present

## 2024-03-07 MED ORDER — PREDNISONE 50 MG PO TABS: 50.0000 mg | ORAL_TABLET | Freq: Every day | ORAL | 0 refills | Status: AC

## 2024-03-07 MED ORDER — PREDNISONE 50 MG PO TABS
50.0000 mg | ORAL_TABLET | Freq: Once | ORAL | Status: AC
  Administered 2024-03-07: 50 mg via ORAL
  Filled 2024-03-07: qty 1

## 2024-03-07 NOTE — Discharge Instructions (Addendum)
 As we discussed, please follow-up with orthopedics for continued management of your knee pain.  Continue to use the prednisone as prescribed, and you can continue to use Tylenol  as needed for pain.  Please refrain from any prolonged standing, as well as any squatting motion that may place more stress onto the knee as this may worsen the swelling and pain that you are experiencing.

## 2024-03-07 NOTE — ED Provider Notes (Signed)
 [place] [facility] Provider Note   CSN: [id number] Arrival date & time: 03/07/24  1321     History  Chief Complaint  Patient presents with   Knee Pain    Melissa Good is a 38 y.o. female who presents today with right knee pain on the anterior superior aspect of the right knee.  This has been going on for several months but she states that over the last 24 to 36 hours she has had increasing pain and swelling in the right knee.  Further states that this has been exacerbated by increased physical activity/squats for exercise.  No recent injury to the knee, does state that she has difficulty ambulating due to pain as well as weightbearing.     Knee Pain Associated symptoms: no fever        Home Medications Prior to Admission medications   Medication Sig Start Date End Date Taking? Authorizing Provider  amoxicillin  (AMOXIL ) 500 MG capsule Take 1 capsule (500 mg total) by mouth 3 (three) times daily. 08/03/18   Ascencion Lava, DMD  HYDROcodone -acetaminophen  (NORCO) 5-325 MG tablet Take 1 tablet by mouth every 6 (six) hours as needed for moderate pain. 08/03/18   Ascencion Lava, DMD  ibuprofen  (ADVIL ) 800 MG tablet Take 1 tablet (800 mg total) by mouth 3 (three) times daily. 09/01/22   Aliene Ip, DO  levonorgestrel (MIRENA) 20 MCG/DAY IUD Mirena 20 mcg/24 hours (5 yrs) 52 mg intrauterine device  Take by intrauterine route. 07/09/13   [provider]  terbinafine (LAMISIL) 250 MG tablet Take 1 tablet by mouth daily. 05/01/18   [provider]      Allergies    Patient has no known allergies.    Review of Systems   Review of Systems  Constitutional:  Negative for chills and fever.  Musculoskeletal:  Positive for arthralgias and joint swelling.  All other systems reviewed and are negative.   Physical Exam Updated Vital Signs BP 120/88 (BP Location: Left Arm)   Pulse 79   Temp 98.2 F (36.8 C) (Oral)   Resp 16    Ht 5\' 4"  (1.626 m)   Wt 108.9 kg   SpO2 100%   BMI 41.20 kg/m  Physical Exam Vitals and nursing note reviewed.  Constitutional:      General: She is not in acute distress.    Appearance: Normal appearance.  HENT:     Head: Normocephalic and atraumatic.     Mouth/Throat:     Mouth: Mucous membranes are moist.     Pharynx: Oropharynx is clear.  Eyes:     Extraocular Movements: Extraocular movements intact.     Conjunctiva/sclera: Conjunctivae normal.     Pupils: Pupils are equal, round, and reactive to light.  Cardiovascular:     Rate and Rhythm: Normal rate and regular rhythm.     Pulses: Normal pulses.          Dorsalis pedis pulses are 2+ on the right side and 2+ on the left side.     Heart sounds: Normal heart sounds. No murmur heard.    No friction rub. No gallop.  Pulmonary:     Effort: Pulmonary effort is normal.     Breath sounds: Normal breath sounds.  Abdominal:     General: Abdomen is flat. Bowel sounds are normal.     Palpations: Abdomen is soft.  Musculoskeletal:        General: Swelling and tenderness present. No deformity. Normal  range of motion.     Cervical back: Normal range of motion and neck supple.     Right knee: Swelling present. No deformity, effusion, erythema, lacerations, bony tenderness or crepitus. Tenderness present over the medial joint line and lateral joint line. No LCL laxity, MCL laxity, ACL laxity or PCL laxity. Normal alignment, normal meniscus and normal patellar mobility.     Instability Tests: Anterior drawer test negative. Posterior drawer test negative. Anterior Lachman test negative. Medial McMurray test negative and lateral McMurray test negative.     Left knee: Normal.     Instability Tests: Anterior drawer test negative. Posterior drawer test negative. Anterior Lachman test negative. Medial McMurray test negative and lateral McMurray test negative.  Skin:    General: Skin is warm and dry.     Capillary Refill: Capillary refill  takes less than 2 seconds.  Neurological:     General: No focal deficit present.     Mental Status: She is alert. Mental status is at baseline.  Psychiatric:        Mood and Affect: Mood normal.     ED Results / Procedures / Treatments   Labs (all labs ordered are listed, but only abnormal results are displayed) Labs Reviewed - No data to display  EKG None  Radiology DG Knee Complete 4 Views Right Result Date: 03/07/2024 CLINICAL DATA:  Right knee pain for several months. No known injury. EXAM: RIGHT KNEE - COMPLETE 4+ VIEW COMPARISON:  March 07, 2016. FINDINGS: No evidence of fracture, dislocation, or joint effusion. No evidence of arthropathy or other focal bone abnormality. Soft tissues are unremarkable. IMPRESSION: Negative. Electronically Signed   By: Rosalene Colon M.D.   On: 03/07/2024 14:28    Procedures Procedures    Medications Ordered in ED Medications - No data to display  ED Course/ Medical Decision Making/ A&P                                 Medical Decision Making Amount and/or Complexity of Data Reviewed Radiology: ordered.   Medical Decision Making:   Melissa Good is a 38 y.o. female who presented to the ED today with pain and swelling to the anterior superior aspect of the right knee detailed above.     Complete initial physical exam performed, notably the patient  was alert and oriented in no apparent distress.  Notable swelling to the anterior superior aspect of the right knee with tenderness to the anterior joint line as well as to the popliteal fossa.  Limited range of motion due to pain in the knee, no crepitus noted with range of motion.  Left leg is unremarkable left knee unremarkable.     Reviewed and confirmed nursing documentation for past medical history, family history, social history.    Initial Assessment:   With the patient's presentation of right knee pain and swelling, most likely diagnosis is bursitis. Other diagnoses were considered  including (but not limited to) septic arthritis, acute ligamentous injury. These are considered less likely due to history of present illness and physical exam findings.     Initial Plan:  Provide dose of oral prednisone Obtain plain film imaging of the right knee Objective evaluation as below reviewed   Initial Study Results:    Radiology:  All images reviewed independently. Agree with radiology report at this time.   DG Knee Complete 4 Views Right Result Date: 03/07/2024 CLINICAL DATA:  Right knee pain for several months. No known injury. EXAM: RIGHT KNEE - COMPLETE 4+ VIEW COMPARISON:  March 07, 2016. FINDINGS: No evidence of fracture, dislocation, or joint effusion. No evidence of arthropathy or other focal bone abnormality. Soft tissues are unremarkable. IMPRESSION: Negative. Electronically Signed   By: Rosalene Colon M.D.   On: 03/07/2024 14:28     Reassessment and Plan:   Based on assessment of this patient, along with review of x-ray of the right knee, believe this patient may be having symptoms consistent with bursitis, will manage this with a course of prednisone and referral to orthopedics for further management.  This was discussed with the patient and she understands agrees, has no further concerns at this time.          Final Clinical Impression(s) / ED Diagnoses Final diagnoses:  None    Rx / DC Orders ED Discharge Orders     None         Juanetta Nordmann, PA 03/07/24 1619    Sueellen Emery, MD 03/07/24 1720

## 2024-03-07 NOTE — ED Notes (Signed)

## 2024-03-07 NOTE — ED Triage Notes (Signed)
 Intermittent right knee pain for several months.  Pain has been constant and more painful since Monday but worst last night and today.  No known injury.  Noted some swelling and warmth.
